# Patient Record
Sex: Male | Born: 1980 | Race: Black or African American | Hispanic: No | Marital: Single | Smoking: Former smoker
Health system: Southern US, Community
[De-identification: ages and names within clinical notes are randomized; demographics above are authoritative.]

## PROBLEM LIST (undated history)

## (undated) DIAGNOSIS — D571 Sickle-cell disease without crisis: Secondary | ICD-10-CM

## (undated) DIAGNOSIS — D5701 Hb-SS disease with acute chest syndrome: Secondary | ICD-10-CM

## (undated) HISTORY — PX: TONSILLECTOMY: SUR1361

## (undated) HISTORY — PX: MINI SHUNT INSERTION: SHX5337

## (undated) HISTORY — PX: OTHER SURGICAL HISTORY: SHX169

## (undated) HISTORY — PX: APPENDECTOMY: SHX54

---
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
## 2004-02-14 ENCOUNTER — Emergency Department (HOSPITAL_COMMUNITY): Admission: EM | Admit: 2004-02-14 | Discharge: 2004-02-14 | Payer: Self-pay | Admitting: Emergency Medicine

## 2004-09-13 ENCOUNTER — Inpatient Hospital Stay (HOSPITAL_COMMUNITY): Admission: EM | Admit: 2004-09-13 | Discharge: 2004-09-17 | Payer: Self-pay | Admitting: Emergency Medicine

## 2007-08-25 ENCOUNTER — Emergency Department (HOSPITAL_COMMUNITY): Admission: EM | Admit: 2007-08-25 | Discharge: 2007-08-25 | Payer: Self-pay | Admitting: Emergency Medicine

## 2007-08-26 ENCOUNTER — Inpatient Hospital Stay (HOSPITAL_COMMUNITY): Admission: EM | Admit: 2007-08-26 | Discharge: 2007-08-27 | Payer: Self-pay | Admitting: Emergency Medicine

## 2007-09-02 ENCOUNTER — Emergency Department (HOSPITAL_COMMUNITY): Admission: EM | Admit: 2007-09-02 | Discharge: 2007-09-02 | Payer: Self-pay | Admitting: Emergency Medicine

## 2007-09-05 ENCOUNTER — Emergency Department (HOSPITAL_COMMUNITY): Admission: EM | Admit: 2007-09-05 | Discharge: 2007-09-05 | Payer: Self-pay | Admitting: *Deleted

## 2007-09-17 ENCOUNTER — Emergency Department (HOSPITAL_COMMUNITY): Admission: EM | Admit: 2007-09-17 | Discharge: 2007-09-17 | Payer: Self-pay | Admitting: Emergency Medicine

## 2007-09-19 ENCOUNTER — Emergency Department (HOSPITAL_COMMUNITY): Admission: EM | Admit: 2007-09-19 | Discharge: 2007-09-19 | Payer: Self-pay | Admitting: Emergency Medicine

## 2007-09-22 ENCOUNTER — Emergency Department (HOSPITAL_COMMUNITY): Admission: EM | Admit: 2007-09-22 | Discharge: 2007-09-22 | Payer: Self-pay | Admitting: Emergency Medicine

## 2007-09-24 ENCOUNTER — Observation Stay (HOSPITAL_COMMUNITY): Admission: EM | Admit: 2007-09-24 | Discharge: 2007-09-24 | Payer: Self-pay | Admitting: Emergency Medicine

## 2008-10-23 ENCOUNTER — Emergency Department (HOSPITAL_COMMUNITY): Admission: EM | Admit: 2008-10-23 | Discharge: 2008-10-23 | Payer: Self-pay | Admitting: Emergency Medicine

## 2009-01-21 ENCOUNTER — Emergency Department (HOSPITAL_COMMUNITY): Admission: EM | Admit: 2009-01-21 | Discharge: 2009-01-21 | Payer: Self-pay | Admitting: Emergency Medicine

## 2009-05-02 ENCOUNTER — Emergency Department (HOSPITAL_COMMUNITY): Admission: EM | Admit: 2009-05-02 | Discharge: 2009-05-02 | Payer: Self-pay | Admitting: Emergency Medicine

## 2009-05-07 ENCOUNTER — Emergency Department (HOSPITAL_COMMUNITY): Admission: EM | Admit: 2009-05-07 | Discharge: 2009-05-07 | Payer: Self-pay | Admitting: Emergency Medicine

## 2009-05-15 ENCOUNTER — Emergency Department (HOSPITAL_COMMUNITY): Admission: EM | Admit: 2009-05-15 | Discharge: 2009-05-15 | Payer: Self-pay | Admitting: Emergency Medicine

## 2009-07-08 ENCOUNTER — Emergency Department (HOSPITAL_COMMUNITY): Admission: EM | Admit: 2009-07-08 | Discharge: 2009-07-08 | Payer: Self-pay | Admitting: Emergency Medicine

## 2009-11-20 ENCOUNTER — Emergency Department (HOSPITAL_COMMUNITY): Admission: EM | Admit: 2009-11-20 | Discharge: 2009-11-20 | Payer: Self-pay | Admitting: Emergency Medicine

## 2009-12-06 ENCOUNTER — Emergency Department (HOSPITAL_COMMUNITY): Admission: EM | Admit: 2009-12-06 | Discharge: 2009-12-06 | Payer: Self-pay | Admitting: Emergency Medicine

## 2009-12-23 ENCOUNTER — Emergency Department (HOSPITAL_COMMUNITY)
Admission: EM | Admit: 2009-12-23 | Discharge: 2009-12-23 | Payer: Self-pay | Source: Home / Self Care | Admitting: Emergency Medicine

## 2010-04-01 LAB — BASIC METABOLIC PANEL
GFR calc Af Amer: >60 mL/min (ref >60)
Glucose, Bld: 108 mg/dL — ABNORMAL HIGH (ref 70–99)
Potassium: 3.5 mEq/L (ref 3.5–5.1)

## 2010-04-01 LAB — CBC
MCH: 24.9 pg — ABNORMAL LOW (ref 26.0–34.0)
MCV: 71.9 fL — ABNORMAL LOW (ref 78.0–100.0)
Platelets: 367 10*3/uL (ref 150–400)
WBC: 13.7 10*3/uL — ABNORMAL HIGH (ref 4.0–10.5)

## 2010-04-02 LAB — COMPREHENSIVE METABOLIC PANEL
ALT: 33 U/L (ref 0–53)
AST: 41 U/L — ABNORMAL HIGH (ref 0–37)
AST: 43 U/L — ABNORMAL HIGH (ref 0–37)
Albumin: 4.1 g/dL (ref 3.5–5.2)
Alkaline Phosphatase: 55 U/L (ref 39–117)
Alkaline Phosphatase: 57 U/L (ref 39–117)
Chloride: 107 mEq/L (ref 96–112)
Chloride: 108 mEq/L (ref 96–112)
Creatinine, Ser: 0.75 mg/dL (ref 0.4–1.5)
Glucose, Bld: 87 mg/dL (ref 70–99)
Potassium: 3.7 mEq/L (ref 3.5–5.1)
Sodium: 141 mEq/L (ref 135–145)
Total Bilirubin: 7.8 mg/dL — ABNORMAL HIGH (ref 0.3–1.2)

## 2010-04-02 LAB — CBC
Hemoglobin: 9.2 g/dL — ABNORMAL LOW (ref 13.0–17.0)
Hemoglobin: 9.6 g/dL — ABNORMAL LOW (ref 13.0–17.0)
MCH: 27 pg (ref 26.0–34.0)
MCHC: 34 g/dL (ref 30.0–36.0)
MCV: 79.3 fL (ref 78.0–100.0)
MCV: 79.5 fL (ref 78.0–100.0)
RBC: 3.39 MIL/uL — ABNORMAL LOW (ref 4.22–5.81)
RBC: 3.58 MIL/uL — ABNORMAL LOW (ref 4.22–5.81)
WBC: 9.9 10*3/uL (ref 4.0–10.5)

## 2010-04-02 LAB — DIFFERENTIAL
Basophils Absolute: 0 10*3/uL (ref 0.0–0.1)
Blasts: 0 %
Eosinophils Absolute: 0 10*3/uL (ref 0.0–0.7)
Eosinophils Absolute: 0.1 10*3/uL (ref 0.0–0.7)
Eosinophils Relative: 0 % (ref 0–5)
Lymphocytes Relative: 24 % (ref 12–46)
Lymphs Abs: 2.5 10*3/uL (ref 0.7–4.0)
Lymphs Abs: 4.2 10*3/uL — ABNORMAL HIGH (ref 0.7–4.0)
Monocytes Absolute: 1.6 10*3/uL — ABNORMAL HIGH (ref 0.1–1.0)
Monocytes Absolute: 1.7 10*3/uL — ABNORMAL HIGH (ref 0.1–1.0)
Myelocytes: 0 %
Neutro Abs: 4 10*3/uL (ref 1.7–7.7)
Neutrophils Relative %: 40 % — ABNORMAL LOW (ref 43–77)
Neutrophils Relative %: 56 % (ref 43–77)
nRBC: 3 /100 WBC — ABNORMAL HIGH

## 2010-04-02 LAB — RETICULOCYTES
RBC.: 3.32 MIL/uL — ABNORMAL LOW (ref 4.22–5.81)
RBC.: 3.56 MIL/uL — ABNORMAL LOW (ref 4.22–5.81)
Retic Count, Absolute: 325.4 10*3/uL — ABNORMAL HIGH (ref 19.0–186.0)
Retic Ct Pct: 9.8 % — ABNORMAL HIGH (ref 0.4–3.1)

## 2010-04-07 LAB — URINALYSIS, ROUTINE W REFLEX MICROSCOPIC
Glucose, UA: NEGATIVE mg/dL
Hgb urine dipstick: NEGATIVE
Ketones, ur: NEGATIVE mg/dL
Nitrite: NEGATIVE
Urobilinogen, UA: 1 mg/dL (ref 0.0–1.0)
pH: 5.5 (ref 5.0–8.0)

## 2010-04-07 LAB — COMPREHENSIVE METABOLIC PANEL
ALT: 31 U/L (ref 0–53)
BUN: 8 mg/dL (ref 6–23)
CO2: 26 mEq/L (ref 19–32)
Calcium: 8.6 mg/dL (ref 8.4–10.5)
Creatinine, Ser: 0.62 mg/dL (ref 0.4–1.5)
GFR calc Af Amer: >60 mL/min (ref >60)
GFR calc non Af Amer: >60 mL/min (ref >60)
Sodium: 137 mEq/L (ref 135–145)
Total Protein: 8 g/dL (ref 6.0–8.3)

## 2010-04-07 LAB — POCT I-STAT, CHEM 8
HCT: 30 % — ABNORMAL LOW (ref 39.0–52.0)
Hemoglobin: 10.2 g/dL — ABNORMAL LOW (ref 13.0–17.0)
Potassium: 3.7 mEq/L (ref 3.5–5.1)
TCO2: 23 mmol/L (ref 0–100)

## 2010-04-07 LAB — CBC
HCT: 27.8 % — ABNORMAL LOW (ref 39.0–52.0)
HCT: 26.3 % — ABNORMAL LOW (ref 39.0–52.0)
MCHC: 33.8 g/dL (ref 30.0–36.0)
MCV: 81.5 fL (ref 78.0–100.0)
Platelets: 266 10*3/uL (ref 150–400)
RDW: 25 % — ABNORMAL HIGH (ref 11.5–15.5)

## 2010-04-07 LAB — RAPID URINE DRUG SCREEN, HOSP PERFORMED
Amphetamines: NOT DETECTED
Barbiturates: NOT DETECTED
Benzodiazepines: NOT DETECTED
Cocaine: NOT DETECTED
Tetrahydrocannabinol: NOT DETECTED

## 2010-04-07 LAB — DIFFERENTIAL
Band Neutrophils: 0 % (ref 0–10)
Basophils Absolute: 0 10*3/uL (ref 0.0–0.1)
Blasts: 0 %
Blasts: 0 %
Eosinophils Absolute: 0.2 10*3/uL (ref 0.0–0.7)
Eosinophils Relative: 2 % (ref 0–5)
Eosinophils Relative: 2 % (ref 0–5)
Lymphocytes Relative: 38 % (ref 12–46)
Lymphocytes Relative: 35 % (ref 12–46)
Lymphs Abs: 5.1 10*3/uL — ABNORMAL HIGH (ref 0.7–4.0)
Metamyelocytes Relative: 0 %
Neutro Abs: 7.7 10*3/uL (ref 1.7–7.7)
Neutrophils Relative %: 44 % (ref 43–77)
Neutrophils Relative %: 50 % (ref 43–77)
Promyelocytes Absolute: 0 %
Promyelocytes Absolute: 0 %
nRBC: 0 /100 WBC

## 2010-04-07 LAB — RETICULOCYTES
RBC.: 3.39 MIL/uL — ABNORMAL LOW (ref 4.22–5.81)
Retic Ct Pct: 9 % — ABNORMAL HIGH (ref 0.4–3.1)

## 2010-04-09 LAB — DIFFERENTIAL
Blasts: 0 %
Eosinophils Absolute: 0 10*3/uL (ref 0.0–0.7)
Eosinophils Relative: 0 % (ref 0–5)
Metamyelocytes Relative: 0 %
Myelocytes: 0 %
Promyelocytes Absolute: 0 %
nRBC: 0 /100 WBC

## 2010-04-09 LAB — CBC
MCHC: 32.9 g/dL (ref 30.0–36.0)
MCV: 85.6 fL (ref 78.0–100.0)
Platelets: 347 10*3/uL (ref 150–400)
RDW: 26.2 % — ABNORMAL HIGH (ref 11.5–15.5)

## 2010-04-09 LAB — RETICULOCYTES
RBC.: 3.05 MIL/uL — ABNORMAL LOW (ref 4.22–5.81)
Retic Ct Pct: 8.6 % — ABNORMAL HIGH (ref 0.4–3.1)

## 2010-04-10 LAB — RAPID STREP SCREEN (MED CTR MEBANE ONLY): Streptococcus, Group A Screen (Direct): NEGATIVE

## 2010-04-25 LAB — POCT I-STAT, CHEM 8
Creatinine, Ser: 0.7 mg/dL (ref 0.4–1.5)
Hemoglobin: 9.9 g/dL — ABNORMAL LOW (ref 13.0–17.0)
Sodium: 140 mEq/L (ref 135–145)
TCO2: 25 mmol/L (ref 0–100)

## 2010-06-04 NOTE — H&P (Signed)
Kerry Hill, Kerry Hill                 ACCOUNT NO.:  [id number]   MEDICAL RECORD NO.:  [id number]          PATIENT TYPE:  OBV   LOCATION:  1436                         FACILITY:  [facility]   PHYSICIAN:  Maretta Bees. Vonita Moss, M.D.DATE OF BIRTH:  08-17-1980   DATE OF ADMISSION:  09/24/2007  DATE OF DISCHARGE:  09/24/2007                              HISTORY & PHYSICAL   CHIEF COMPLAINT:  Priapism.   HISTORY OF PRESENT ILLNESS:  Kerry Hill is a 30 year old African American  male with a history of sickle cell SS disease, who is well known to our  practice.  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  He  has been seen probably 6-7 times in our emergency department within the  last month, and he has also seen his urologist and hematologist at [facility].  More recently, he was seen in our emergency department on  September 02, 2007, where he had irrigation and phenylephrine, and again,  on September 05, 2007.  He was seen at [facility] on September 16, 2007 and then  back in our emergency department on September 17, 2007.  On September 19, 2007, again, he was irrigated, given phenylephrine, and was given an  injection of Lupron at that time.  He returned on September 22, 2007 and  was again irrigated and treated with phenylephrine.  He was seen at [facility] on September 23, 2007 but this was a scheduled appointment and  he did not have priapism at that time.  He states that he went home  yesterday afternoon.  When he fell asleep last night, he woke up this  morning with a return of his priapism.  He returns today to the  emergency department complaining of the exact same symptoms he has  reported within the last month.  His erection is nonpurposeful and  painful.  He denies no change from his prior symptomatology.  Of note,  he did have a right-sided Winter's shunt done last week at [facility] and has a large hematoma towards the  distal end of his shaft,  which he states has been unchanged.  Otherwise, he denies any changes of  his recent health.   PAST MEDICAL HISTORY:  Positive for sickle cell disease.   PAST SURGICAL HISTORY:  None.   CURRENT MEDICATIONS:  None.   ALLERGIES:  None.   SOCIAL HISTORY:  He attends [employer].  He is single.  He does have an 12-  month-old son.  He does not smoke, drink alcohol, or use illicit drugs.   FAMILY HISTORY:  Noncontributory.  There is no known family history of  genitourinary malignancy or nephrolithiasis.   REVIEW OF SYSTEMS:  A 10-point review of systems was conducted, and with  the exception of the history of present illness, is negative.   PHYSICAL EXAMINATION:  A well-developed and well-nourished, pleasant 30-  year-old [place] American male awake, alert and oriented x3, nontoxic in  appearance.  Temperature, he is afebrile right now.  His blood pressure is 147/72.  Pulse is 65.  Respiratory rate is 12.  Pulse ox is 96-100% on room air.  LUNGS:  Clear to auscultation bilaterally.  HEART:  Regular rate and rhythm without murmurs, clicks, rubs, or extra  sounds.  ABDOMEN:  Without visible mass or pulsation.  Soft, nontender,  nondistended.  No rebound or guarding.  GU:  Normal male scrotal anatomy.  Testicles are descended bilaterally,  nontender without mass or tenderness.  He has a prominent erection  similar to his exam two days ago.  It is tender to palpation.  His  corporal bodies are palpated proximally, and they are firm.  His glans  is soft, but he does have a persistent large hematoma involving the  distal two-thirds of his penile shaft.  EXTREMITIES:  Warm and dry without clubbing, cyanosis or edema.  MENTAL STATUS:  Appropriate.  Speech is clear.   PROCEDURE:  With the patient in a supine position with his verbal  consent given, we cleansed his phallus with Betadine solution and  prepped and draped him sterilely.  We anesthetized the skin with 2%   plain lidocaine, placed a 21 gauge butterfly needle in his right  corporal body and irrigated 500 cc of light maroon-colored blood fairly  easily.  He was given a total of the three separate 1 ml aliquots of  phenylephrine with prompt detumescence.  The butterfly needle was  removed, and pressure was held over the site.  There was no hematoma  here.  A Coban dressing was applied lightly.   During the aspiration/irrigation, he became diaphoretic and dizzy.  He  was hemodynamically stable throughout.  He did have a hemoglobin checked  prior to the irrigation, and it was 8.6, which is not far from his  baseline.  In light of this, being that this is the first time he became  symptomatic with irrigation, we have elected to admit him to the 23-hour  observation unit, keep him hydrated, keep him on nasal oxygen, and  recheck a hemoglobin in the morning.  Hopefully we can offer him some  symptomatic control as well as get a handle on why his priapism is so  recurrent.   ASSESSMENT:  A 30 year old African American male with sickle cell  anemia, recent priapism.   PLAN:  Admit to 23-hour observation.      Melina Schools, MD      Maretta Bees. Vonita Moss, M.D.  Electronically Signed    JR/MEDQ  D:  09/24/2007  T:  09/24/2007  Job:  [id number]

## 2010-06-04 NOTE — Consult Note (Signed)
NAMEQAIS, JOWERS                 ACCOUNT NO.:  [id number]   MEDICAL RECORD NO.:  [id number]          PATIENT TYPE:  EMS   LOCATION:  ED                           FACILITY:  [facility]   PHYSICIAN:  Lindaann Slough, M.D.  DATE OF BIRTH:  May 18, 1980   DATE OF CONSULTATION:  09/02/2007  DATE OF DISCHARGE:  09/02/2007                                 CONSULTATION   REASON FOR CONSULTATION:  Priapism.   The patient is 30 years old male with a history of sickle cell disease.  He had an episode of priapism this morning at home around 7 o'clock and  he has continued to have penile pain and came to the emergency room for  further evaluation.  He has been having priapism on and off since age  21.  He had a Winter or [place] shunt several years ago.  However, he  continues to have priapism.  He was seen in the emergency room here on  August 6th for an episode of priapism and was treated at [facility] 2-3 days  ago.  He does not have any voiding symptoms.   PAST MEDICAL HISTORY:  Is positive for sickle cell disease.   SOCIAL HISTORY:  He is single, has one child who is a 29-year-old.   PAST SURGICAL HISTORY:  He had a penile shunt, appendectomy and  tonsillectomy.   FAMILY HISTORY:  His parents have sickle cell trait and his sister has  sickle cell trait.   MEDICATIONS:  Amoxicillin for sore throat and Percocet for pain.   ALLERGIES:  NO KNOWN DRUG ALLERGIES.   REVIEW OF SYSTEMS:  Is as noted in the HPI and everything else is  negative.   PHYSICAL EXAMINATION:  This is a well-developed 76 years of male who is  complaining of penile pain.  He is alert and oriented to time, place and  person.  Blood pressure is 141/69, pulse 86, respirations 20.  His head  is normal.  He has pink conjunctivae.  NECK:  Is supple.  He has no cervical adenopathy, no thyromegaly.  LUNGS:  Lungs are clear to percussion and auscultation.  HEART:  Regular rhythm.  ABDOMEN: Abdomen is soft, nondistended, nontender.  He  has no CVA  tenderness.  Kidneys are not palpable.  Bladder is not distended.  Penis is erect and tender and the scrotum is normal.  Both testicles  feel normal.   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   PLAN:  I prepped the penis with Betadine solution, infiltrated the  penile skin with 2% lidocaine.  I then and placed a #14 gauge needle on  the right corpus and drained large amount of blood and on the left side  of the corpus I inserted a 25 gauge needle and injected 1 mL of 100 mcg  of Neo-Synephrine.  There was some detumescence and I continue to  irrigate the corpus and removed and aspirated more blood out.  I  injected a total of 4 mL in increments of 1mL of Neo-Synephrine in the  left corpus for a total of 400  mcg of Neo-Synephrine.  He had good  response and then the  penis was soft and nontender and he had good pain relief.  I then  removed both needles from the corpus and I applied some pressure  dressing to the penis.  The patient states that he is going to [facility] for  further evaluation and a blood transfusion today.  We will see him here  as needed.      Lindaann Slough, M.D.  Electronically Signed     MN/MEDQ  D:  09/02/2007  T:  09/02/2007  Job:  [id number]

## 2010-06-04 NOTE — Consult Note (Signed)
Kerry Hill, Kerry Hill NO.:  [id number]   MEDICAL RECORD NO.:  [id number]          PATIENT TYPE:  EMS   LOCATION:  ED                           FACILITY:  [facility]   PHYSICIAN:  Courtney Paris, M.D.DATE OF BIRTH:  12-Apr-1980   DATE OF CONSULTATION:  09/19/2007  DATE OF DISCHARGE:  09/19/2007                                 CONSULTATION   REASON FOR CONSULTATION:  Priapism and sickle-cell disease.   This 30 year old black male with sickle cell disease since age 57 enters  with recurrent priapism.  He was here irrigated by Dr. Brunilda Payor on August  16.  He went to [facility] on the 27th and was a irrigated and again on the  28th by Dr. Vernie Ammons.  The priapism started about 4 hours ago and he  comes in for pain.  He has had this repeatedly since he was 16.  While  in the [employer] living in [place] he received Lupron for about a year or  maybe longer and during that time did not have any priapism.  He is  asking for that again today.  Both his parents have sickle cell trait.  He has one sister who did not have the trait.  He has little boy who  also has the trait.  He is not married.  He is incapacitated by sickle  cell disease and comes in for treatment of priapism.   Past medical history, social history and review of systems are as noted  and negative except otherwise.  He was seen for a sickle cell crisis on  August 6.  He has had a lot more this month than he has had in, he says,  8 years.  He apparently had either a Winter or an [place] shunt  several years ago.  Again, like I said, he has been seen at [facility] twice  this month and then this is the third visit to the emergency room for  irrigation this month as well.   PHYSICAL EXAM:  CONSTITUTIONAL:  He is a young black male in some pain  but he has had some pain medicine and has had terbutaline and oxygen,  and none of this seemed to help relieve this.  VITAL SIGNS:  His temperature is 97.4 is pulses 53.  His blood  pressure  is 128/71.  HEENT:  Exam is negative.  HEART AND LUNGS:  Clear.  ABDOMEN:  Soft, benign.  GENITOURINARY:  Penis is markedly engorged but the glans is soft.  It is  typical of priapism.  The distal half of his penis is fairly swollen  which he says has been normal recently.  Testes are normally descended  without pain or tenderness.  Bilaterally descended.  EXTREMITIES:  Negative.  No edema.   After he was sedated somewhat with pain medicine and was prepped and  draped in a sterile fashion and a little bit of 1% Xylocaine with 1:1000  epinephrine, a little small weal was made on each side, first the left  and then the right side of the penis at about 3 and  9 o'clock.  On the  left side, I placed an 18 gauge needle and then proceeded to aspirate  probably about 800 mL of blood.  This this cause some tumescence.  He  was still a little swollen so I injected some 1:1000 epinephrine in the  dose of 1 mg of Neo-Synephrine in 100 mL of fluid.  He got 1 mL of this  on the right side and then this was repeated on the left side.  I was  unable to aspirate any more after this.  He says this is with his penis,  although still somewhat swollen and somewhat woody in appearance, he  says this was his normal state.  He does not having pain but was still  somewhat swollen but flaccid.  About 800 mL of blood was aspirated.   By his choice he wanted Lupron.  I ordered 7.5 mg of Lupron IM given  which we will give him this for the next month or 2.  He knows that he  will not have erections on the Lupron and may have some hot flashes as  well.  Further long-term disposition of this needs to be made probably  at [facility] where he goes for his sickle cell.      Courtney Paris, M.D.  Electronically Signed     HMK/MEDQ  D:  09/19/2007  T:  09/19/2007  Job:  [id number]

## 2010-06-04 NOTE — H&P (Signed)
NAMEDIANDRE, Kerry Hill NO.:  [id number]   MEDICAL RECORD NO.:  [id number]          PATIENT TYPE:  INP   LOCATION:  0101                         FACILITY:  [facility]   PHYSICIAN:  Vania Rea, M.D. DATE OF BIRTH:  11/02/1980   DATE OF ADMISSION:  08/26/2007  DATE OF DISCHARGE:                              HISTORY & PHYSICAL   PRIMARY CARE PHYSICIAN:  Dr. James Ivanoff at [facility].   CHIEF COMPLAINT:  Priapism.   HISTORY OF PRESENT ILLNESS:  This is a 30 year old African American  gentleman with a history of sickle cell SS disease who came to the  emergency room early yesterday morning for chest congestion and upper  respiratory symptoms.  He was evaluated and sent home with Motrin.  He  woke up yesterday afternoon with a painful achy erection.  Five hours  later, the erection persisted.  He took Viagra as directed by his  urologist at [facility], who apparently are evaluating Viagra as a treatment  for priapism.  There was no relief of symptoms and eventually he came to  the emergency room.  In the emergency room, he is being seen by the  urologist is draining priapism and he has had a much relief from this.  However, because of his abnormal laboratory findings and his continued  chest pain, chest congestion and sore throat, the hospitalist service  was called to assist with management.   The patient says he usually requires hospitalization about once every 3  years for sickle crisis and is actually fairly stable.  He does not take  any medications at home for pain.  He has not really noticed any fevers,  but he was visited by his children and his children's mother  the day  before yesterday and they had upper respiratory symptoms.  He has had no  bloody cough and he has no limb pain.   PAST MEDICAL HISTORY:  Sickle cell disease.   MEDICATIONS:  Other than those above, none.   ALLERGIES:  NO KNOWN DRUG ALLERGIES.   SOCIAL HISTORY:  Denies tobacco or  illicit drug use.  He occasionally  takes a drink of alcohol.   FAMILY HISTORY:  No family history of heart disease, diabetes,  hypertension or sickle cell disease.   REVIEW OF SYSTEMS:  Other than noted above, 10-point review of systems  was unremarkable.   PHYSICAL EXAMINATION:  GENERAL:  A well-built young African American  gentleman lying in bed, mouth breathing, but otherwise in no distress.  VITAL SIGNS:  His temperature is 97.8, pulse is 105, respirations 20,  blood pressure 156/85.  Saturating at 96% on room air.  HEENT:  His pupils are round and equal.  Mucous membranes pink but  icteric.  His mouth is dry and he has yellow exudate around the  posterior pharynx.  NECK:  He has no cervical lymphadenopathy.  No thyromegaly or  jugulovenous distention.  CHEST:  Clear to auscultation bilaterally.  CARDIOVASCULAR SYSTEM:  Regular rhythm.  ABDOMEN:  Soft and nontender.  There are no masses.  EXTREMITIES: Without edema.  He has  2+ pulses bilaterally.  CENTRAL NERVOUS SYSTEM:  Cranial nerves II-XII are grossly intact.  He  has no focal neurologic deficit.   LABORATORY DATA:  His white count is 29.9, hemoglobin 9.6, MCV 74.8,  platelets 340.  His sodium is 138, potassium 3.4, chloride 105, CO2 of  26, BUN 6, creatinine 0.76, glucose 98.  His total protein is 7.9,  albumin 4.  AST and ALT are normal.  For some reason, I do not see a  bilirubin.  His chest x-ray shows pulmonary hyperaeration, pulmonary  vascular congestion, no evidence of pulmonary edema.   ASSESSMENT:  1. Acute purulent pharyngitis.  2. Priapism.  3. Sickle cell disease with current mild crisis.  4. Unexplained microcytic anemia.   PLAN:  Will admit this gentleman to the medical floor.  Urologist has  already been consulted.  Will treat him with IV fluids.  Culture his  throat and treat him with penicillin for the time being.  Will do iron  studies because of his unexplained microcytosis.  Other plans as per   orders.      Vania Rea, M.D.  Electronically Signed     LC/MEDQ  D:  08/26/2007  T:  08/26/2007  Job:  [id number]   cc:   Jacques Navy, MD  Division of Hematology  [facility]

## 2010-06-07 NOTE — Discharge Summary (Signed)
[place]  Patient:    Kerry Hill, Kerry Hill                          MRN: [id number] Adm. Date:  [id number] Disc. Date: [id number] Attending:  Judie Petit                           Discharge Summary  ADMITTING DIAGNOSES: 1. Pneumonia. 2. Sickle cell disease, rule out early acute chest syndrome. 3. Intermittent priapism.  DISCHARGE DIAGNOSIS: 1. Pneumonia. 2. Sickle cell disease, rule out early acute chest syndrome. 3. Intermittent priapism.  DISCHARGE CONDITION:  Stable.  DISPOSITION:  Followup in my office in approximately between one and two weeks.  DISCHARGE MEDICATIONS: 1. Biaxin 500 mg p.o. b.i.d. for 21 days. 2. Ibuprofen 800 mg t.i.d. for pain. 3. Oxycontin CR 40 mg b.i.d. p.r.n. pain. 4. Sudafed 50 mg t.i.d. or q.i.d. in addition for priapism episodes.  CONSULTANT:  Dr. Mahalia Longest, [facility], pediatric department, the patients primary care physician from that area.  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 The patient was seen last week in my office and had routine labs done, which were unremarkable. He is from [facility] in [place], [place]. His only sickle cell manifestations was episodes of intermittent pain an priapism. The patient was discussed with Dr. Mahalia Longest, pediatric rheumatologist at [facility] in [place], and we decided to try Sudafed 50 mg with Ibuprofen 800 mg and Oxycontin CR 40 mg b.i.d. for his priapism episodes. The patient apparently got complete resolution of this problem. He had had a hospitalization in [place] earlier and required an injection of dilute epinephrine by a urologist. This was discussed as an alternative for the patient to be able to do in the  outpatient setting, but he respectfully declined to consider that one further. His presenting problem mostly was associated with burning taking a deep breath and pain his lower chest regions with intermittent cough. He had also awakened with rapid respirations, which were rather shallow, during the middle of the previous night. He denied pain elsewhere and was attempting to continue attending his class attendance.  He did call the morning of admission and was requested to come in the office after discussing his changes. Thus, the temperature was 99.9 and auscultation of his chest revealed rhonchi/rales of his right and left lung bases, being more pronounced on the left, with inability to take a deep breath. He was worked up with a chest x-ray which revealed basilar atelectasis versus pneumonia, and he was admitted for further treatment.  PAST MEDICAL HISTORY:  The patient had been hospitalized in the past with pneumonia and priapism, sickle cell vaso-occlusive crisis.  HOME MEDICATIONS: 1. Brethine 5 mg occasional use. 2. Hydroxyurea 5 mg b.i.d. 3. Motrin 800 mg t.i.d. 4. Folate 1 mg daily. 5. Oxycontin CR 40 mg b.i.d. 6. Sudafed 50 mg t.i.d. or q.i.d. p.r.n. priapism.  PERSONAL HISTORY:  The patient does not use alcohol, tobacco, drugs or PICA.  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  REVIEW OF SYSTEMS:  Otherwise, remarkable only for intermittent priapism.  PHYSICAL EXAMINATION:  VITAL SIGNS:  Blood pressure  140/70, pulse 100, respirations 20, temperature 101.9 degrees orally at the hospital. GENERAL: The patient was alert, ambulatory, healthy-appearing, right-handed black male. HEENT:  Moderate scleral icterus. The patient was wearing glasses. NECK: Supple without jugular vein distension, bruit, nodes or thyromegaly. CHEST: Rales and rhonchi at the left greater than the right lung bases  posteriorly and splinting expirations. HEART:  Regular rate and rhythm without murmur. ABDOMEN:  Soft and flat. Bowel sounds normal. GENITALIA:  Bilaterally descended testicles, circumcised penis. Priapism had resolved. EXTREMITIES: Normal. NEUROLOGIC:  The patient was grossly alert and fully oriented. He was ambulatory without difficulty. SKIN:  Slight hyperthermia.  LABORATORY DATA:  Chest x-ray revealed cardiomegaly with proper atelectasis in the left base, although pneumonia could not be excluded. Repeat films were reviewed and improved aeration with persistent small bilateral effusions. Interval x-ray revealed interval increase in left lower lobe pneumonia with x-ray on October 4 revealing improved aeration with persistent small bilateral effusions.  CBC revealed a white count of 16,700 with a hemoglobin of 9.8, hematocrit 28.5, platelets 239,000 with followup revealing white count down to 14.5 with a hemoglobin of 9.3 and platelets 338,000. Retic count was 14.7. Chemistries initially revealed a sodium of 130, potassium 3.7, glucose 168, calcium 8.3, AST 41 and total bilirubin 6.71. Repeat prior to discharge revealed parameters had returned to normal, except for albumin reduced to 3.2 with AST of 53, ALT 42, total bilirubin 3.21. Blood cultures x 2 and urine culture had no growth.  HOSPITAL COURSE:  The patient was admitted to the hospital and given the same home medicines; Tylenol 650 mg was alternated with Ibuprofen 600 mg q.3h. for fever and pain. He was given Dilaudid 2 mg IV q.2-4h. p.r.n. pain. He was given Rocephin 1 g IV daily, and gradually improved. He was continued on hydroxyurea 1000 mg daily. The patient had greater evolution of his splinting expirations and was begun on Biaxin 5 mg p.o. b.i.d., and was comfortable on oral medicines. He had received Tequin 4 mg IV daily, also. After discussion with Dr. Rebeca Allegra at [facility], he suggested the patient  would be well-served to have Biaxin b.i.d. for 21 days or so. Thus, this was done and the patient was comfortable with discharge outpatient management. DD:  11/04/99 TD:  11/04/99 Job: [id number]  [id number]

## 2010-06-07 NOTE — Discharge Summary (Signed)
Kerry Hill, Kerry Hill                 ACCOUNT NO.:  [id number]   MEDICAL RECORD NO.:  [id number]          PATIENT TYPE:  INP   LOCATION:  1317                         FACILITY:  [facility]   PHYSICIAN:  Lorelle Formosa, M.D.DATE OF BIRTH:  Nov 07, 1980   DATE OF ADMISSION:  09/13/2004  DATE OF DISCHARGE:  09/17/2004                                 DISCHARGE SUMMARY   ADMISSION DIAGNOSES:  1.  Sickle cell SS vaso-occlusive crisis.  2.  Pyrexia.   DISCHARGE DIAGNOSES:  1.  Sickle cell SS vaso-occlusive crisis.  2.  Acute chest syndrome of moderate type.   DISCHARGE CONDITION:  Stable.   DISCHARGE DISPOSITION:  Follow up in my office in approximately 1-2 weeks.   DISCHARGE MEDICATIONS:  1.  MS Contin 60 mg b.i.d.  2.  MSIR 15 mg q.i.d. p.r.n. breakthrough pain.  3.  Hydroxyurea 1500mg   daily.  4.  Zithromax 250 mg daily as instructed.   HISTORY:  This patient is a 30 year old single black male who has known SS  sickle cell disease.  He presented with severe pain in his lower back  unrelieved by home medicines.  His home medicines included OxyContin CR 4 mg  b.i.d., OxyIR 5 mg q.i.d. p.r.n. breakthrough pain, and ibuprofen 800 mg  t.i.d. p.r.n.  The patient drove himself to my office and was ambulatory  without significant apparent discomfort, and arrangements were made for  inpatient care; however, no beds were available, and patient went home to  take more medicine and await a vacancy.  Subdequently he did go to the  emergency room and was evaluated by Dr. Lynelle Doctor with chest x-ray, CBC, CMET,  and started on IV fluids, initiated on O2, Rocephin, and azithromycin IV.  I  was called to admit the patient.   PAST MEDICAL HISTORY:  Patient has been admitted several times with priapism  and pneumonia and vaso-occlusive crisis.   He had no known allergies.   His family history, social history, review of systems, personal history are  all on the admission history and physical.   PHYSICAL EXAMINATION:  VITAL SIGNS:  Blood pressure 135/56, pulse 93,  respirations 22, temperature 101.4.  GENERAL:  Patient was alert and oriented upon arrival.  He appeared be a  well-developed and well-nourished African-American male.  HEENT:  He had moderate scleral icterus.  Mouth exam revealed no problems  with dentition.  Tongue and uvula were both midline.  Moist mucous  membranes.  NECK:  Supple without jugular venous distention or bruits.  No palpable  thyromegaly.  LUNGS:  Clear to auscultation.  HEART:  Regular rate and rhythm without murmur.  ABDOMEN:  Soft.  EXTREMITIES:  No lesions.  NEUROLOGIC:  Grossly physiologic.  He is ambulatory with a rolling gait.  Grimacing and intermittent jerking due to pain.   LABS:  CBC revealed a white count of 24.4 with hemoglobin of 9 and  hematocrit 24.9.  At discharge, white count was 21.4 with hemoglobin 7.2.  Red blood cell morphology revealed marked polychromasia target cells, sickle  cell leukocytes, and oval macrocytes.  Retic percentage  was 11.5.  Chemistries were not significant except for above.  Blood cultures had no  growth.   Chest x-ray revealed no focal air space opacities to suggest pneunonia.   HOSPITAL COURSE:  The patient was admitted to the hospital and given IV  fluids of nasal cannula O2, analgesics intravenously, and monitored.  He  gradually improved and was able to be transferred to oral medicines and was  discharged with outpatient management.      Lorelle Formosa, M.D.  Electronically Signed     WWM/MEDQ  D:  10/09/2004  T:  10/10/2004  Job:  [id number]

## 2010-10-18 LAB — BASIC METABOLIC PANEL
BUN: 5 — ABNORMAL LOW
CO2: 27
Calcium: 8.6
Creatinine, Ser: 0.58
GFR calc Af Amer: >60

## 2010-10-18 LAB — DIFFERENTIAL
Basophils Relative: 0
Basophils Relative: 2 — ABNORMAL HIGH
Eosinophils Relative: 0
Eosinophils Relative: 1
Lymphocytes Relative: 16
Lymphs Abs: 2
Monocytes Absolute: 1.6 — ABNORMAL HIGH
Monocytes Relative: 10
Neutro Abs: 11.4 — ABNORMAL HIGH
Neutrophils Relative %: 80 — ABNORMAL HIGH

## 2010-10-18 LAB — POCT I-STAT, CHEM 8
BUN: 4 — ABNORMAL LOW
Calcium, Ion: 1.11 — ABNORMAL LOW
Chloride: 106
Creatinine, Ser: 0.9
TCO2: 25

## 2010-10-18 LAB — COMPREHENSIVE METABOLIC PANEL
AST: 42 — ABNORMAL HIGH
Albumin: 4
Calcium: 8.7
Chloride: 105
Creatinine, Ser: 0.76
GFR calc Af Amer: >60
Total Protein: 7.9

## 2010-10-18 LAB — VIRUS CULTURE: Virus Culture Final: NEGATIVE

## 2010-10-18 LAB — RETICULOCYTES
RBC.: 3.67 — ABNORMAL LOW
Retic Ct Pct: 11.3 — ABNORMAL HIGH
Retic Ct Pct: 9.6 — ABNORMAL HIGH

## 2010-10-18 LAB — CBC
HCT: 25.8 — ABNORMAL LOW
Hemoglobin: 9 — ABNORMAL LOW
MCHC: 34.2
MCHC: 34.8
MCHC: 34.8
MCV: 74.8 — ABNORMAL LOW
MCV: 76.8 — ABNORMAL LOW
Platelets: 317
Platelets: 340
RBC: 3.35 — ABNORMAL LOW
RBC: 3.37 — ABNORMAL LOW
WBC: 29.9 — ABNORMAL HIGH

## 2010-10-18 LAB — PROTIME-INR
INR: 1.2
Prothrombin Time: 16 — ABNORMAL HIGH

## 2010-10-18 LAB — MAGNESIUM: Magnesium: 2.2

## 2010-10-18 LAB — BILIRUBIN, FRACTIONATED(TOT/DIR/INDIR): Total Bilirubin: 7.4 — ABNORMAL HIGH

## 2010-10-18 LAB — FERRITIN: Ferritin: 103 (ref 22–322)

## 2010-10-18 LAB — CULTURE, BLOOD (ROUTINE X 2): Culture: NO GROWTH

## 2010-10-23 LAB — CBC
HCT: 26.4 — ABNORMAL LOW
MCHC: 32.7
MCV: 82.8
Platelets: 351

## 2010-11-15 ENCOUNTER — Emergency Department (HOSPITAL_COMMUNITY)
Admission: EM | Admit: 2010-11-15 | Discharge: 2010-11-15 | Disposition: A | Discharge status: Home / Self Care | Payer: Medicaid Other | Attending: Emergency Medicine | Admitting: Emergency Medicine

## 2010-11-15 DIAGNOSIS — N4830 Priapism, unspecified: Secondary | ICD-10-CM | POA: Insufficient documentation

## 2010-11-15 DIAGNOSIS — D571 Sickle-cell disease without crisis: Secondary | ICD-10-CM | POA: Insufficient documentation

## 2010-11-15 DIAGNOSIS — Z79899 Other long term (current) drug therapy: Secondary | ICD-10-CM | POA: Insufficient documentation

## 2010-11-15 LAB — CBC
HCT: 25.6 % — ABNORMAL LOW (ref 39.0–52.0)
Hemoglobin: 8.9 g/dL — ABNORMAL LOW (ref 13.0–17.0)
MCH: 25.1 pg — ABNORMAL LOW (ref 26.0–34.0)
MCHC: 34.8 g/dL (ref 30.0–36.0)
RBC: 3.54 MIL/uL — ABNORMAL LOW (ref 4.22–5.81)

## 2010-11-15 LAB — BASIC METABOLIC PANEL
BUN: 9 mg/dL (ref 6–23)
CO2: 24 mEq/L (ref 19–32)
Calcium: 9.1 mg/dL (ref 8.4–10.5)
GFR calc non Af Amer: >90 mL/min (ref >90)
Glucose, Bld: 102 mg/dL — ABNORMAL HIGH (ref 70–99)

## 2010-11-15 LAB — DIFFERENTIAL
Basophils Relative: 2 % — ABNORMAL HIGH (ref 0–1)
Eosinophils Relative: 2 % (ref 0–5)
Lymphs Abs: 4.4 10*3/uL — ABNORMAL HIGH (ref 0.7–4.0)
Monocytes Absolute: 2 10*3/uL — ABNORMAL HIGH (ref 0.1–1.0)
Neutro Abs: 6.2 10*3/uL (ref 1.7–7.7)

## 2011-05-15 ENCOUNTER — Encounter (HOSPITAL_COMMUNITY): Payer: Self-pay | Admitting: Emergency Medicine

## 2011-05-15 ENCOUNTER — Emergency Department (HOSPITAL_COMMUNITY)
Admission: EM | Admit: 2011-05-15 | Discharge: 2011-05-15 | Disposition: A | Discharge status: Home / Self Care | Payer: Medicaid Other | Attending: Emergency Medicine | Admitting: Emergency Medicine

## 2011-05-15 ENCOUNTER — Emergency Department (HOSPITAL_COMMUNITY): Payer: Medicaid Other

## 2011-05-15 DIAGNOSIS — R070 Pain in throat: Secondary | ICD-10-CM | POA: Insufficient documentation

## 2011-05-15 DIAGNOSIS — R0982 Postnasal drip: Secondary | ICD-10-CM | POA: Insufficient documentation

## 2011-05-15 DIAGNOSIS — J069 Acute upper respiratory infection, unspecified: Secondary | ICD-10-CM

## 2011-05-15 DIAGNOSIS — R220 Localized swelling, mass and lump, head: Secondary | ICD-10-CM | POA: Insufficient documentation

## 2011-05-15 DIAGNOSIS — R221 Localized swelling, mass and lump, neck: Secondary | ICD-10-CM | POA: Insufficient documentation

## 2011-05-15 DIAGNOSIS — R599 Enlarged lymph nodes, unspecified: Secondary | ICD-10-CM | POA: Insufficient documentation

## 2011-05-15 DIAGNOSIS — J3489 Other specified disorders of nose and nasal sinuses: Secondary | ICD-10-CM | POA: Insufficient documentation

## 2011-05-15 DIAGNOSIS — Z79899 Other long term (current) drug therapy: Secondary | ICD-10-CM | POA: Insufficient documentation

## 2011-05-15 DIAGNOSIS — D571 Sickle-cell disease without crisis: Secondary | ICD-10-CM | POA: Insufficient documentation

## 2011-05-15 MED ORDER — AZITHROMYCIN 250 MG PO TABS: 250.0000 mg | ORAL_TABLET | Freq: Every day | ORAL | Status: AC

## 2011-05-15 MED ORDER — AZITHROMYCIN 250 MG PO TABS
500.0000 mg | ORAL_TABLET | Freq: Once | ORAL | Status: AC
  Administered 2011-05-15: 500 mg via ORAL
  Filled 2011-05-15: qty 2

## 2011-05-15 MED ORDER — HYDROCOD POLST-CHLORPHEN POLST 10-8 MG/5ML PO LQCR: 5.0000 mL | Freq: Two times a day (BID) | ORAL | Status: DC

## 2011-05-15 NOTE — Progress Notes (Addendum)
Pt voiced concern with wanting to see another pcp during 05/15/11 ED visit Provided with list of available medicaid [place] [facility] and [facility] pcp information

## 2011-05-15 NOTE — Discharge Instructions (Signed)
Antibiotic Nonuse  Your caregiver felt that the infection or problem was not one that would be helped with an antibiotic. Infections may be caused by viruses or bacteria. Only a caregiver can tell which one of these is the likely cause of an illness. A cold is the most common cause of infection in both adults and children. A cold is a virus. Antibiotic treatment will have no effect on a viral infection. Viruses can lead to many lost days of work caring for sick children and many missed days of school. Children may catch as many as 10 "colds" or "flus" per year during which they can be tearful, cranky, and uncomfortable. The goal of treating a virus is aimed at keeping the ill person comfortable. Antibiotics are medications used to help the body fight bacterial infections. There are relatively few types of bacteria that cause infections but there are hundreds of viruses. While both viruses and bacteria cause infection they are very different types of germs. A viral infection will typically go away by itself within 7 to 10 days. Bacterial infections may spread or get worse without antibiotic treatment. Examples of bacterial infections are:  Sore throats (like strep throat or tonsillitis).   Infection in the lung (pneumonia).   Ear and skin infections.  Examples of viral infections are:  Colds or flus.   Most coughs and bronchitis.   Sore throats not caused by Strep.   Runny noses.  It is often best not to take an antibiotic when a viral infection is the cause of the problem. Antibiotics can kill off the helpful bacteria that we have inside our body and allow harmful bacteria to start growing. Antibiotics can cause side effects such as allergies, nausea, and diarrhea without helping to improve the symptoms of the viral infection. Additionally, repeated uses of antibiotics can cause bacteria inside of our body to become resistant. That resistance can be passed onto harmful bacterial. The next time  you have an infection it may be harder to treat if antibiotics are used when they are not needed. Not treating with antibiotics allows our own immune system to develop and take care of infections more efficiently. Also, antibiotics will work better for us when they are prescribed for bacterial infections. Treatments for a child that is ill may include:  Give extra fluids throughout the day to stay hydrated.   Get plenty of rest.   Only give your child over-the-counter or prescription medicines for pain, discomfort, or fever as directed by your caregiver.   The use of a cool mist humidifier may help stuffy noses.   Cold medications if suggested by your caregiver.  Your caregiver may decide to start you on an antibiotic if:  The problem you were seen for today continues for a longer length of time than expected.   You develop a secondary bacterial infection.  SEEK MEDICAL CARE IF:  Fever lasts longer than 5 days.   Symptoms continue to get worse after 5 to 7 days or become severe.   Difficulty in breathing develops.   Signs of dehydration develop (poor drinking, rare urinating, dark colored urine).   Changes in behavior or worsening tiredness (listlessness or lethargy).  Document Released: 03/17/2001 Document Revised: 12/26/2010 Document Reviewed: 09/13/2008 ExitCare Patient Information 2012 [employer].Upper Respiratory Infection, Adult An upper respiratory infection (URI) is also sometimes known as the common cold. The upper respiratory tract includes the nose, sinuses, throat, trachea, and bronchi. Bronchi are the airways leading to the lungs. Most   people improve within 1 week, but symptoms can last up to 2 weeks. A residual cough may last even longer.  CAUSES Many different viruses can infect the tissues lining the upper respiratory tract. The tissues become irritated and inflamed and often become very moist. Mucus production is also common. A cold is contagious. You can easily  spread the virus to others by oral contact. This includes kissing, sharing a glass, coughing, or sneezing. Touching your mouth or nose and then touching a surface, which is then touched by another person, can also spread the virus. SYMPTOMS  Symptoms typically develop 1 to 3 days after you come in contact with a cold virus. Symptoms vary from person to person. They may include:  Runny nose.   Sneezing.   Nasal congestion.   Sinus irritation.   Sore throat.   Loss of voice (laryngitis).   Cough.   Fatigue.   Muscle aches.   Loss of appetite.   Headache.   Low-grade fever.  DIAGNOSIS  You might diagnose your own cold based on familiar symptoms, since most people get a cold 2 to 3 times a year. Your caregiver can confirm this based on your exam. Most importantly, your caregiver can check that your symptoms are not due to another disease such as strep throat, sinusitis, pneumonia, asthma, or epiglottitis. Blood tests, throat tests, and X-rays are not necessary to diagnose a common cold, but they may sometimes be helpful in excluding other more serious diseases. Your caregiver will decide if any further tests are required. RISKS AND COMPLICATIONS  You may be at risk for a more severe case of the common cold if you smoke cigarettes, have chronic heart disease (such as heart failure) or lung disease (such as asthma), or if you have a weakened immune system. The very young and very old are also at risk for more serious infections. Bacterial sinusitis, middle ear infections, and bacterial pneumonia can complicate the common cold. The common cold can worsen asthma and chronic obstructive pulmonary disease (COPD). Sometimes, these complications can require emergency medical care and may be life-threatening. PREVENTION  The best way to protect against getting a cold is to practice good hygiene. Avoid oral or hand contact with people with cold symptoms. Wash your hands often if contact occurs.  There is no clear evidence that vitamin C, vitamin E, echinacea, or exercise reduces the chance of developing a cold. However, it is always recommended to get plenty of rest and practice good nutrition. TREATMENT  Treatment is directed at relieving symptoms. There is no cure. Antibiotics are not effective, because the infection is caused by a virus, not by bacteria. Treatment may include:  Increased fluid intake. Sports drinks offer valuable electrolytes, sugars, and fluids.   Breathing heated mist or steam (vaporizer or shower).   Eating chicken soup or other clear broths, and maintaining good nutrition.   Getting plenty of rest.   Using gargles or lozenges for comfort.   Controlling fevers with ibuprofen or acetaminophen as directed by your caregiver.   Increasing usage of your inhaler if you have asthma.  Zinc gel and zinc lozenges, taken in the first 24 hours of the common cold, can shorten the duration and lessen the severity of symptoms. Pain medicines may help with fever, muscle aches, and throat pain. A variety of non-prescription medicines are available to treat congestion and runny nose. Your caregiver can make recommendations and may suggest nasal or lung inhalers for other symptoms.  HOME CARE INSTRUCTIONS     Only take over-the-counter or prescription medicines for pain, discomfort, or fever as directed by your caregiver.   Use a warm mist humidifier or inhale steam from a shower to increase air moisture. This may keep secretions moist and make it easier to breathe.   Drink enough water and fluids to keep your urine clear or pale yellow.   Rest as needed.   Return to work when your temperature has returned to normal or as your caregiver advises. You may need to stay home longer to avoid infecting others. You can also use a face mask and careful hand washing to prevent spread of the virus.  SEEK MEDICAL CARE IF:   After the first few days, you feel you are getting worse  rather than better.   You need your caregiver's advice about medicines to control symptoms.   You develop chills, worsening shortness of breath, or brown or red sputum. These may be signs of pneumonia.   You develop yellow or brown nasal discharge or pain in the face, especially when you bend forward. These may be signs of sinusitis.   You develop a fever, swollen neck glands, pain with swallowing, or white areas in the back of your throat. These may be signs of strep throat.  SEEK IMMEDIATE MEDICAL CARE IF:   You have a fever.   You develop severe or persistent headache, ear pain, sinus pain, or chest pain.   You develop wheezing, a prolonged cough, cough up blood, or have a change in your usual mucus (if you have chronic lung disease).   You develop sore muscles or a stiff neck.  Document Released: 07/02/2000 Document Revised: 12/26/2010 Document Reviewed: 05/10/2010 Exit[employer] Patient Information 2012 [employer]. 

## 2011-05-15 NOTE — ED Provider Notes (Signed)
History     CSN: [id number]  Arrival date & time 05/15/11  1509   First MD Initiated Contact with Patient 05/15/11 1512      No chief complaint on file.   (Consider location/radiation/quality/duration/timing/severity/associated sxs/prior treatment) HPI  Pt presents to the ED with complaints of sore throat, fevers, cough. He denies abdominal pain, N/V/D, anorexia, headache, SOB, CP. Sore Throat: Patient complains of sore throat. Associated symptoms include nasal blockage, post nasal drip, sinus and nasal congestion and sore throat.Onset of symptoms was 3 days ago, stable since that time. He is drinking plenty of fluids. He does not have had recent close exposure to someone with proven streptococcal pharyngitis but is in college. He is also a sickle cell patient who denies having his normal sickle cell exacerbation symptoms of priapism.   No past medical history on file.  No past surgical history on file.  No family history on file.  History  Substance Use Topics  . Smoking status: Not on file  . Smokeless tobacco: Not on file  . Alcohol Use: Not on file      Review of Systems   HEENT: denies blurry vision or change in hearing PULMONARY: Denies difficulty breathing and SOB CARDIAC: denies chest pain or heart palpitations MUSCULOSKELETAL:  denies being unable to ambulate ABDOMEN AL: denies abdominal pain GU: denies loss of bowel or urinary control NEURO: denies numbness and tingling in extremities   Allergies  Review of patient's allergies indicates no known allergies.  Home Medications   Current Outpatient Rx  Name Route Sig Dispense Refill  . OXYCODONE HCL 15 MG PO TABS Oral Take 30 mg by mouth every 4 (four) hours as needed. PAIN    . AZITHROMYCIN 250 MG PO TABS Oral Take 1 tablet (250 mg total) by mouth daily. Take first 2 tablets together, then 1 every day until finished. 5 tablet 0    Starting tomorrow  . HYDROCOD POLST-CPM POLST ER 10-8 MG/5ML PO LQCR Oral  Take 5 mLs by mouth every 12 (twelve) hours. 140 mL 0    There were no vitals taken for this visit.  Physical Exam  Nursing note and vitals reviewed. Constitutional: He is oriented to person, place, and time. He appears well-developed and well-nourished. No distress.  HENT:  Head: Normocephalic and atraumatic. No trismus in the jaw.  Right Ear: Tympanic membrane, external ear and ear canal normal.  Left Ear: Tympanic membrane, external ear and ear canal normal.  Nose: Nose normal. No rhinorrhea. Right sinus exhibits no maxillary sinus tenderness and no frontal sinus tenderness. Left sinus exhibits no maxillary sinus tenderness and no frontal sinus tenderness.  Mouth/Throat: Uvula is midline and mucous membranes are normal. Normal dentition. No dental abscesses or uvula swelling. Oropharyngeal exudate and posterior oropharyngeal edema present. No posterior oropharyngeal erythema or tonsillar abscesses.       No submental edema, tongue not elevated, no trismus. No impending airway obstruction; Pt able to speak full sentences, swallow intact, no drooling, stridor, or tonsillar/uvula displacement. No palatal petechia  Eyes: Conjunctivae are normal.  Neck: Trachea normal, normal range of motion and full passive range of motion without pain. Neck supple. No rigidity. Erythema present. Normal range of motion present. No Brudzinski's sign noted.       Flexion and extension of neck without pain or difficulty. Able to breath without difficulty in extension.  Cardiovascular: Normal rate and regular rhythm.   Pulmonary/Chest: Effort normal and breath sounds normal. No stridor. No respiratory distress. He  has no wheezes.  Abdominal: Soft. There is no tenderness.       No obvious evidence of splenomegaly. Non ttp.   Musculoskeletal: Normal range of motion.  Lymphadenopathy:       Head (right side): No preauricular and no posterior auricular adenopathy present.       Head (left side): No preauricular and  no posterior auricular adenopathy present.    He has cervical adenopathy.  Neurological: He is alert and oriented to person, place, and time.  Skin: Skin is warm and dry. No rash noted. He is not diaphoretic.  Psychiatric: He has a normal mood and affect.    ED Course  Procedures (including critical care time)  Labs Reviewed - No data to display Dg Chest 2 View  05/15/2011  *RADIOLOGY REPORT*  Clinical Data: Cough and congestion  CHEST - 2 VIEW  Comparison: 07/08/2009  Findings: The heart size and mediastinal contours are within normal limits.  Both lungs are clear.  The visualized skeletal structures are unremarkable.  IMPRESSION: Negative exam.  Original Report Authenticated By: Rosealee Albee, M.D.     1. URI (upper respiratory infection)       MDM  Chest xray normal. Throat erythematous. Pt drinking plenty of fluids. NAD, understands plan that he needs to follow-up with his PCP. Pt prescribed Tussionex and Azithromycin.  Pt has been advised of the symptoms that warrant their return to the ED. Patient has voiced understanding and has agreed to follow-up with the PCP or specialist.         Dorthula Matas, PA 05/15/11 1623

## 2011-05-19 NOTE — ED Provider Notes (Signed)
Medical screening examination/treatment/procedure(s) were performed by non-physician practitioner and as supervising physician I was immediately available for consultation/collaboration.   Gwyneth Sprout, MD 05/19/11 [phone]

## 2011-10-03 ENCOUNTER — Observation Stay (HOSPITAL_COMMUNITY)
Admission: EM | Admit: 2011-10-03 | Discharge: 2011-10-05 | Disposition: A | Discharge status: Home / Self Care | Payer: Medicaid Other | Attending: Internal Medicine | Admitting: Internal Medicine

## 2011-10-03 ENCOUNTER — Encounter (HOSPITAL_COMMUNITY): Payer: Self-pay | Admitting: Emergency Medicine

## 2011-10-03 ENCOUNTER — Emergency Department (HOSPITAL_COMMUNITY): Payer: Medicaid Other

## 2011-10-03 DIAGNOSIS — J180 Bronchopneumonia, unspecified organism: Principal | ICD-10-CM | POA: Diagnosis present

## 2011-10-03 DIAGNOSIS — R0789 Other chest pain: Secondary | ICD-10-CM | POA: Insufficient documentation

## 2011-10-03 DIAGNOSIS — D571 Sickle-cell disease without crisis: Secondary | ICD-10-CM | POA: Diagnosis present

## 2011-10-03 DIAGNOSIS — I517 Cardiomegaly: Secondary | ICD-10-CM | POA: Insufficient documentation

## 2011-10-03 DIAGNOSIS — R079 Chest pain, unspecified: Secondary | ICD-10-CM | POA: Diagnosis present

## 2011-10-03 DIAGNOSIS — J189 Pneumonia, unspecified organism: Secondary | ICD-10-CM

## 2011-10-03 HISTORY — DX: Sickle-cell disease without crisis: D57.1

## 2011-10-03 HISTORY — DX: Hb-SS disease with acute chest syndrome: D57.01

## 2011-10-03 LAB — CBC WITH DIFFERENTIAL/PLATELET
Basophils Absolute: 0 K/uL (ref 0.0–0.1)
Basophils Relative: 0 % (ref 0–1)
Eosinophils Absolute: 0.1 10*3/uL (ref 0.0–0.7)
Eosinophils Relative: 1 % (ref 0–5)
HCT: 25.9 % — ABNORMAL LOW (ref 39.0–52.0)
Hemoglobin: 8.9 g/dL — ABNORMAL LOW (ref 13.0–17.0)
Lymphocytes Relative: 24 % (ref 12–46)
Lymphs Abs: 3.3 10*3/uL (ref 0.7–4.0)
MCH: 25.1 pg — ABNORMAL LOW (ref 26.0–34.0)
MCHC: 34.4 g/dL (ref 30.0–36.0)
MCV: 73 fL — ABNORMAL LOW (ref 78.0–100.0)
Monocytes Absolute: 2 10*3/uL — ABNORMAL HIGH (ref 0.1–1.0)
Monocytes Relative: 15 % — ABNORMAL HIGH (ref 3–12)
Neutro Abs: 8.2 K/uL — ABNORMAL HIGH (ref 1.7–7.7)
Neutrophils Relative %: 60 % (ref 43–77)
Platelets: 317 10*3/uL (ref 150–400)
RBC: 3.55 MIL/uL — ABNORMAL LOW (ref 4.22–5.81)
RDW: 25.3 % — ABNORMAL HIGH (ref 11.5–15.5)
WBC: 13.6 K/uL — ABNORMAL HIGH (ref 4.0–10.5)

## 2011-10-03 LAB — BASIC METABOLIC PANEL
BUN: 5 mg/dL — ABNORMAL LOW (ref 6–23)
Chloride: 106 mEq/L (ref 96–112)
Creatinine, Ser: 0.63 mg/dL (ref 0.50–1.35)
GFR calc Af Amer: >90 mL/min (ref >90)
Glucose, Bld: 92 mg/dL (ref 70–99)
Potassium: 3.9 mEq/L (ref 3.5–5.1)

## 2011-10-03 LAB — BASIC METABOLIC PANEL WITH GFR
CO2: 23 meq/L (ref 19–32)
Calcium: 8.8 mg/dL (ref 8.4–10.5)
GFR calc non Af Amer: >90 mL/min (ref >90)
Sodium: 140 meq/L (ref 135–145)

## 2011-10-03 LAB — SAMPLE TO BLOOD BANK

## 2011-10-03 LAB — TROPONIN I
Troponin I: <0.3 ng/mL (ref <0.30)
Troponin I: <0.3 ng/mL (ref <0.30)

## 2011-10-03 LAB — RETICULOCYTES
RBC.: 3.58 MIL/uL — ABNORMAL LOW (ref 4.22–5.81)
Retic Count, Absolute: 612.2 10*3/uL — ABNORMAL HIGH (ref 19.0–186.0)
Retic Ct Pct: 17.1 % — ABNORMAL HIGH (ref 0.4–3.1)

## 2011-10-03 LAB — HEPATIC FUNCTION PANEL
Albumin: 4.3 g/dL (ref 3.5–5.2)
Indirect Bilirubin: 6.7 mg/dL — ABNORMAL HIGH (ref 0.3–0.9)
Total Bilirubin: 7.1 mg/dL — ABNORMAL HIGH (ref 0.3–1.2)

## 2011-10-03 MED ORDER — HYDROXYZINE HCL 25 MG PO TABS
25.0000 mg | ORAL_TABLET | ORAL | Status: DC | PRN
  Filled 2011-10-03: qty 2

## 2011-10-03 MED ORDER — ALBUTEROL SULFATE (5 MG/ML) 0.5% IN NEBU
2.5000 mg | INHALATION_SOLUTION | Freq: Four times a day (QID) | RESPIRATORY_TRACT | Status: AC
  Administered 2011-10-03 – 2011-10-04 (×4): 2.5 mg via RESPIRATORY_TRACT
  Filled 2011-10-03 (×4): qty 0.5

## 2011-10-03 MED ORDER — KETOROLAC TROMETHAMINE 15 MG/ML IJ SOLN
15.0000 mg | Freq: Four times a day (QID) | INTRAMUSCULAR | Status: DC | PRN
  Administered 2011-10-03 – 2011-10-05 (×3): 15 mg via INTRAVENOUS
  Filled 2011-10-03 (×3): qty 1

## 2011-10-03 MED ORDER — OXYCODONE HCL 5 MG PO TABS
15.0000 mg | ORAL_TABLET | Freq: Four times a day (QID) | ORAL | Status: DC | PRN
  Administered 2011-10-05: 15 mg via ORAL
  Filled 2011-10-03 (×2): qty 3

## 2011-10-03 MED ORDER — SODIUM CHLORIDE 0.9 % IV BOLUS (SEPSIS)
500.0000 mL | Freq: Once | INTRAVENOUS | Status: AC
  Administered 2011-10-03: 500 mL via INTRAVENOUS

## 2011-10-03 MED ORDER — ACETAMINOPHEN 650 MG RE SUPP: 650.0000 mg | RECTAL | Status: DC | PRN

## 2011-10-03 MED ORDER — HYDROMORPHONE HCL PF 1 MG/ML IJ SOLN
2.0000 mg | Freq: Once | INTRAMUSCULAR | Status: AC
  Administered 2011-10-03: 2 mg via INTRAVENOUS
  Filled 2011-10-03 (×2): qty 2

## 2011-10-03 MED ORDER — HYDROMORPHONE HCL PF 2 MG/ML IJ SOLN
2.0000 mg | INTRAMUSCULAR | Status: DC | PRN
  Administered 2011-10-03 – 2011-10-05 (×6): 2 mg via INTRAVENOUS
  Filled 2011-10-03 (×6): qty 1

## 2011-10-03 MED ORDER — FOLIC ACID 1 MG PO TABS
1.0000 mg | ORAL_TABLET | Freq: Every day | ORAL | Status: DC
  Administered 2011-10-04 – 2011-10-05 (×2): 1 mg via ORAL
  Filled 2011-10-03 (×2): qty 1

## 2011-10-03 MED ORDER — ONDANSETRON HCL 4 MG/2ML IJ SOLN: 4.0000 mg | INTRAMUSCULAR | Status: DC | PRN

## 2011-10-03 MED ORDER — ALBUTEROL SULFATE (5 MG/ML) 0.5% IN NEBU
5.0000 mg | INHALATION_SOLUTION | Freq: Once | RESPIRATORY_TRACT | Status: AC
  Administered 2011-10-03: 5 mg via RESPIRATORY_TRACT
  Filled 2011-10-03: qty 1

## 2011-10-03 MED ORDER — HYDROCODONE-ACETAMINOPHEN 5-325 MG PO TABS: 1.0000 | ORAL_TABLET | ORAL | Status: DC | PRN

## 2011-10-03 MED ORDER — KETOROLAC TROMETHAMINE 30 MG/ML IJ SOLN
30.0000 mg | Freq: Once | INTRAMUSCULAR | Status: AC
  Administered 2011-10-03: 30 mg via INTRAVENOUS
  Filled 2011-10-03: qty 1

## 2011-10-03 MED ORDER — HYDROMORPHONE HCL PF 1 MG/ML IJ SOLN: 1.0000 mg | INTRAMUSCULAR | Status: DC | PRN

## 2011-10-03 MED ORDER — DEXTROSE 5 % IV SOLN
1.0000 g | INTRAVENOUS | Status: DC
  Administered 2011-10-03: 1 g via INTRAVENOUS
  Filled 2011-10-03 (×2): qty 10

## 2011-10-03 MED ORDER — DEXTROSE 5 % IV SOLN
500.0000 mg | Freq: Once | INTRAVENOUS | Status: AC
  Administered 2011-10-03: 500 mg via INTRAVENOUS
  Filled 2011-10-03: qty 500

## 2011-10-03 MED ORDER — ENOXAPARIN SODIUM 40 MG/0.4ML ~~LOC~~ SOLN
40.0000 mg | SUBCUTANEOUS | Status: DC
  Administered 2011-10-03: 40 mg via SUBCUTANEOUS
  Filled 2011-10-03 (×2): qty 0.4

## 2011-10-03 MED ORDER — ACETAMINOPHEN 325 MG PO TABS
650.0000 mg | ORAL_TABLET | ORAL | Status: DC | PRN
  Administered 2011-10-04: 650 mg via ORAL
  Filled 2011-10-03: qty 2

## 2011-10-03 MED ORDER — DEXTROSE 5 % IV SOLN
500.0000 mg | INTRAVENOUS | Status: DC
  Filled 2011-10-03: qty 500

## 2011-10-03 MED ORDER — HYDROMORPHONE HCL PF 1 MG/ML IJ SOLN
1.0000 mg | INTRAMUSCULAR | Status: DC | PRN
  Administered 2011-10-03: 1 mg via INTRAVENOUS
  Filled 2011-10-03: qty 1

## 2011-10-03 MED ORDER — ONDANSETRON HCL 4 MG PO TABS: 4.0000 mg | ORAL_TABLET | ORAL | Status: DC | PRN

## 2011-10-03 NOTE — ED Notes (Signed)
Attempted to call report again. Placed on hold.

## 2011-10-03 NOTE — ED Notes (Signed)
Patient reports pain in the right lung on inspiration since Monday which is causing him to breath shallow.   Patient denies cough or fevers.

## 2011-10-03 NOTE — ED Notes (Signed)
Resp called for breathing treatment.   

## 2011-10-03 NOTE — ED Notes (Signed)
Pt currently in no resp distress. Pt breathing without issue or effort. Pt sitting down playing on phone while discussing symptoms.

## 2011-10-03 NOTE — ED Notes (Signed)
Pt reports pain improved with medication. Pt reports pain 4/10.

## 2011-10-03 NOTE — ED Notes (Signed)
Pt reports right sided flank/rib pain starting on Monday. Pt reports he has had similar pain before with PNA. Pt reports last case of PNA was last year.  Pt reports he also has sickle cell pain and pain sometimes presents in this manner.  Pt reports increased cough, productive with yellow sputum. Pt reports pain is worse with deep breathing.  Pt denies fever.  Pt reports he had left over antibiotics from a previous hospital stay that he started taking last night- patient reports medication is a zpack.  Pt has been taking oxycodone for pain with last dose last night.  Pt has not had anything for pain today.

## 2011-10-03 NOTE — ED Notes (Signed)
Pt denies pain with palpation or movement.  Pt reports pain only with cough or deep breathing

## 2011-10-03 NOTE — ED Notes (Signed)
PT came in with with black Acer laptop and with a black evo cell phone

## 2011-10-03 NOTE — Progress Notes (Signed)
Kerry Hill, is a 31 y.o. male,   MRN: [id number]  -  DOB - 06/29/1980  Outpatient Primary MD for the patient is Billee Cashing, MD  in for    Chief Complaint  Patient presents with  . Pleurisy     Blood pressure 130/59, pulse 81, temperature 98.3 F (36.8 C), temperature source Oral, resp. rate 18, SpO2 93.00%.  Principal Problem:  *Bronchopneumonia Active Problems:  Sickle cell disease   31 yo hx sickle cell presents to  ED with right sided chest pain, hurts to take a deep breath or cough, has been coughing a little more, dry, in the past 1 day, CP been there for about 1 week. Pt reports h/o acute chest but denies receiving transfusion for it. Has Sickle ss disease, followed by Dr. Ronne Binning. He took himself off of folic acid and hydroxyurea because he didn't notice a difference. He takes 15 of oxycodone twice daily for chronic pain from sickle. No h/o MI, stroke. He reports normally with pain he gets priapism, not joint pains. No fevers, chills. Doesn't smoke. Feels SOB. No radiation of pain to back or lower abd or groin. Urine Has been dark.   Work up in Ed yields chest xray with  patchy bronchopneumonia suspected in the right lower lobe. Mild cardiomegaly, with interval increase in the heart size over the past 4 years. WC 13.6, RBC and HG seem at baseline for him.  On exam pt NAD non-toxic appearing. Hemodynamically stable. Afebrile. States pain much improved.   Given nebs, IV dilaudid and toradol as well as zithromax and rocephin in ED.   Given xray and ?hx of acute chest syndrome will admit to tele obs.

## 2011-10-03 NOTE — ED Provider Notes (Addendum)
History     CSN: [id number]  Arrival date & time 10/03/11  1037   First MD Initiated Contact with Patient 10/03/11 1148      Chief Complaint  Patient presents with  . Pleurisy    (Consider location/radiation/quality/duration/timing/severity/associated sxs/prior treatment) HPI Comments: Pt with right sided chest pain, hurts to take a deep breath or cough, has been coughing a little more, dry, in the past 1 day, CP been there for about 1 week.  Pt reports h/o acute chest but denies receiving transfusion for it.  Has Sickle ss disease, followed by Dr. Ronne Binning.  He took himself off of folic acid and hydroxyurea because he didn't notice a difference.  He takes 15 of oxycodone twice daily for chronic pain from sickle.  No h/o MI, stroke.  He reports normally with pain he gets priapism, not joint pains.  No fevers, chills.  Doesn't smoke.  Feels SOB.  No radiation of pain to back or lower abd or groin.  Urine  Has been dark.    The history is provided by the patient.    Past Medical History  Diagnosis Date  . Sickle cell disease     History reviewed. No pertinent past surgical history.  History reviewed. No pertinent family history.  History  Substance Use Topics  . Smoking status: Current Every Day Smoker    Types: Cigarettes  . Smokeless tobacco: Former Neurosurgeon  . Alcohol Use: No      Review of Systems  Constitutional: Negative for fever and chills.  Respiratory: Positive for cough and shortness of breath.   Cardiovascular: Positive for chest pain.  Gastrointestinal: Negative for nausea, vomiting, abdominal pain and diarrhea.  Genitourinary: Positive for hematuria and flank pain. Negative for dysuria, frequency and testicular pain.  Musculoskeletal: Negative for back pain.  Neurological: Negative for headaches.  All other systems reviewed and are negative.    Allergies  Review of patient's allergies indicates no known allergies.  Home Medications   Current Outpatient  Rx  Name Route Sig Dispense Refill  . AZITHROMYCIN 250 MG PO TABS Oral Take 250-500 mg by mouth daily. 2 tab on Day 1, 1 tab Days 2-5    . OXYCODONE HCL 15 MG PO TABS Oral Take 30 mg by mouth every 6 (six) hours as needed. PAIN      BP 130/59  Pulse 81  Temp 98.3 F (36.8 C) (Oral)  Resp 18  SpO2 93%  Physical Exam  Nursing note and vitals reviewed. Constitutional: He is oriented to person, place, and time. He appears well-developed and well-nourished.  HENT:  Head: Normocephalic and atraumatic.  Pulmonary/Chest: No respiratory distress. He has no wheezes. He has no rales. Chest wall is not dull to percussion. He exhibits tenderness. He exhibits no mass, no bony tenderness, no laceration, no crepitus, no edema and no retraction.  Abdominal: Soft. Normal appearance. He exhibits no distension. There is no tenderness. There is no rebound, no guarding and no CVA tenderness.  Neurological: He is alert and oriented to person, place, and time.  Skin: Skin is warm and dry. No rash noted.    ED Course  Procedures (including critical care time)  Labs Reviewed  CBC WITH DIFFERENTIAL - Abnormal; Notable for the following:    WBC 13.6 (*)  WHITE COUNT CONFIRMED ON SMEAR   RBC 3.55 (*)     Hemoglobin 8.9 (*)     HCT 25.9 (*)     MCV 73.0 (*)  MCH 25.1 (*)     RDW 25.3 (*)     Monocytes Relative 15 (*)     Neutro Abs 8.2 (*)     Monocytes Absolute 2.0 (*)     All other components within normal limits  BASIC METABOLIC PANEL - Abnormal; Notable for the following:    BUN 5 (*)     All other components within normal limits  RETICULOCYTES - Abnormal; Notable for the following:    Retic Ct Pct 17.1 (*)  RESULTS CONFIRMED BY MANUAL DILUTION   RBC. 3.58 (*)     Retic Count, Manual 612.2 (*)     All other components within normal limits  TROPONIN I  SAMPLE TO BLOOD BANK   Dg Chest 2 View  10/03/2011  *RADIOLOGY REPORT*  Clinical Data: Cough.  Chest pain.  Sickle cell crisis.  CHEST - 2  VIEW  Comparison: Two-view chest x-ray 05/15/2011, 07/08/2009, 08/26/2007.  Findings: Cardiac silhouette enlarged, with slight interval increase in size over the 4-year interval.  Hilar and mediastinal contours otherwise unremarkable.  Pulmonary vascularity normal. Patchy opacities in the right lower lobe.  Lungs otherwise clear. No pleural effusions.  Thoracic scoliosis convex right and mild changes of sickle osteopathy.  IMPRESSION: Patchy bronchopneumonia suspected in the right lower lobe.  Mild cardiomegaly, with interval increase in the heart size over the past 4 years.   Original Report Authenticated By: Arnell Sieving, M.D.      1. Community acquired pneumonia   2. Sickle cell anemia     RA sat of 93% is adequate, but low normal.   3:22 PM Spoke to [facility] with [facility] who will admit, will put in under obs status to telemetry.     MDM  Pt with pleuritic type pain, no ischemia on ECG, CXR shows patchy airspace disease and pt has been coughing more in past 2 days.  Given h/o sickle cell, I recommend admission considering RA sat is marginal at 93 and also has h/o acute chest syndrome.  WBC is 13.6, retic count high at 17.1.         Gavin Pound. Oletta Lamas, MD 10/03/11 1516  Gavin Pound. Keeghan Mcintire, MD 10/03/11 [phone]

## 2011-10-03 NOTE — H&P (Addendum)
[facility] History and Physical  Arber Wiemers ZOX:[id number] DOB: Mar 20, 1980 DOA: 10/03/2011  Referring physician: Dr. Oletta Lamas PCP: Billee Cashing, MD   Chief Complaint: Pleuritic chest pain and shortness of breath  HPI: Kerry Hill is a 31 y.o. male with past medical history significant for sickle cell disease and history of acute chest syndrome in the past. Come to the hospital complaining of right-sided chest pleuritic pain and also increase shortness of breath. Patient reports that he symptoms has been present for about a week prior to admission, but over the last 2 days has steadily worsened to the point that he decided to come for further evaluation. In the emergency department patient was found with elevated wbc's, elevated reticulocyte count, chest x-ray with patchy infiltrates of his right lung with high concerns for pneumonia. Patient oxygen saturation was borderline normal at 92% on room air. Due to the past history of acute chest syndrome [facility] has been called to admit the patient for observation and to provide further evaluation and treatment.   Review of Systems: Negative except as otherwise mentioned on history of present illness. Past Medical History  Diagnosis Date  . Sickle cell disease   . Acute chest syndrome     ? in past   Past Surgical History  Procedure Date  . Appendectomy    Social History:  reports that he quit smoking about 3 years ago. His smoking use included Cigarettes. He has never used smokeless tobacco. He reports that he drinks alcohol. He reports that he does not use illicit drugs. Patient came from home and is able to perform all his activities of daily living without any assistance.  No Known Allergies  History reviewed. No pertinent family history. per patient no pertinent past medical history of importance of his family members.  Prior to Admission medications   Medication Sig Start Date End Date Taking? Authorizing Provider    azithromycin (ZITHROMAX) 250 MG tablet Take 250-500 mg by mouth daily. 2 tab on Day 1, 1 tab Days 2-5   Yes Historical Provider, MD  oxyCODONE (ROXICODONE) 15 MG immediate release tablet Take 30 mg by mouth every 6 (six) hours as needed. PAIN   Yes Historical Provider, MD   Physical Exam: Filed Vitals:   10/03/11 1044 10/03/11 1548 10/03/11 1653 10/03/11 1712  BP: 130/59 143/63  134/70  Pulse: 81 83 97 72  Temp: 98.3 F (36.8 C) 98.1 F (36.7 C)  98.4 F (36.9 C)  TempSrc: Oral Oral  Oral  Resp: 18 20 19 20   SpO2: 93% 94% 95% 97%     General:  Mild distress secondary to pain, able to speak in full sentences.  Eyes: No icterus, no nystagmus, PERRLA, extraocular muscles intact  ENT: Moist mucous membranes, no erythema or exudate inside his mouth, ears and nostrils without any discharges.  Neck: Supple, no thyromegaly, no bruits or JVD  Cardiovascular: Regular rate and rhythm, no murmurs, no gallops or rubs.  Respiratory: Scattered rhonchi (right more than left), no crackles, no wheezing  Abdomen: Soft, nontender, nondistended, positive bowel sounds.  Skin: No rash or petechiae.  Musculoskeletal: Full range of motion, no joint swelling or erythema. Patient complaining of right side chest and back pain.  Psychiatric: No suicidal ideation no hallucinations.  Neurologic: Alert, awake and oriented x3, Cranial nerve intact, muscle strength 5 out of 5 bilaterally and symmetrically; no focal motor or sensory deficit appreciated on exam  Labs on Admission:  Basic Metabolic Panel:  Lab 10/03/11 8119  10/03/11 1209  NA 140 --  K 3.9 --  CL 106 --  CO2 23 --  GLUCOSE 92 --  BUN 5* --  CREATININE 0.63 --  CALCIUM 8.8 --  MG -- 2.1  PHOS -- --   Liver Function Tests:  Lab 10/03/11 1209  AST 42*  ALT 17  ALKPHOS 57  BILITOT 7.1*  PROT 8.2  ALBUMIN 4.3   CBC:  Lab 10/03/11 1215  WBC 13.6*  NEUTROABS 8.2*  HGB 8.9*  HCT 25.9*  MCV 73.0*  PLT 317   Cardiac  Enzymes:  Lab 10/03/11 1215  CKTOTAL --  CKMB --  CKMBINDEX --  TROPONINI <0.30    Radiological Exams on Admission: Dg Chest 2 View  10/03/2011  *RADIOLOGY REPORT*  Clinical Data: Cough.  Chest pain.  Sickle cell crisis.  CHEST - 2 VIEW  Comparison: Two-view chest x-ray 05/15/2011, 07/08/2009, 08/26/2007.  Findings: Cardiac silhouette enlarged, with slight interval increase in size over the 4-year interval.  Hilar and mediastinal contours otherwise unremarkable.  Pulmonary vascularity normal. Patchy opacities in the right lower lobe.  Lungs otherwise clear. No pleural effusions.  Thoracic scoliosis convex right and mild changes of sickle osteopathy.  IMPRESSION: Patchy bronchopneumonia suspected in the right lower lobe.  Mild cardiomegaly, with interval increase in the heart size over the past 4 years.   Original Report Authenticated By: Arnell Sieving, M.D.     EKG: Regular rate and rate in, no ST segment elevation or depression. No acute signs of ischemia.  Assessment/Plan 1-community acquired pneumonia: Patient will be admitted, started on Rocephin and Zithromax; when necessary nebulizer treatments and incentive spirometer. Incentive spirometer, Legionella antigen and also a strep antigen in urine has been order. Will follow culture results and provide supportive care.  2-chest pain: Most likely secondary to patient pneumonia and mild sickle cell disease. Patient will be admitted to telemetry, will cycle his cardiac enzymes to be oral and follow his symptoms. At this moment his hemoglobin level, lack hypoxia and also reticulocytes counts are demonstrating just mild exacerbation of his sickle cell and not a frank acute chest syndrome. Will follow his clinical involution and if needed will perform exchange transfusion.   3-Sickle cell disease: Patient will receive IV fluids, pain medication, folic acid and supportive care. At this moment will also provide treatment of the triggering cause  for his sickle cell crisis which is the infection in his lungs. As mentioned above concern will be for acute chest syndrome development and if this occurred will proceed with x-ray interpretation.  DVT: Lovenox.   Code Status: Full code Family Communication: No family at bedside Disposition Plan: Home when medically stable  Time spent: More than 30 minutes  Sabrine Patchen [facility] Pager [phone]  If 7PM-7AM, please contact night-coverage [email or web address] Password [facility] 10/03/2011, 6:49 PM

## 2011-10-03 NOTE — ED Notes (Signed)
Attempted to call report, nurse unable to come to phone

## 2011-10-04 LAB — TROPONIN I: Troponin I: <0.3 ng/mL (ref <0.30)

## 2011-10-04 LAB — STREP PNEUMONIAE URINARY ANTIGEN: Strep Pneumo Urinary Antigen: NEGATIVE

## 2011-10-04 MED ORDER — OXYCODONE HCL 10 MG PO TB12
10.0000 mg | ORAL_TABLET | Freq: Two times a day (BID) | ORAL | Status: DC
  Administered 2011-10-04: 10 mg via ORAL
  Filled 2011-10-04 (×2): qty 1

## 2011-10-04 MED ORDER — MOXIFLOXACIN HCL 400 MG PO TABS
400.0000 mg | ORAL_TABLET | Freq: Every day | ORAL | Status: DC
  Administered 2011-10-04: 400 mg via ORAL
  Filled 2011-10-04 (×2): qty 1

## 2011-10-04 MED ORDER — ENOXAPARIN SODIUM 60 MG/0.6ML ~~LOC~~ SOLN
50.0000 mg | SUBCUTANEOUS | Status: DC
  Administered 2011-10-04: 50 mg via SUBCUTANEOUS
  Filled 2011-10-04: qty 1
  Filled 2011-10-04: qty 0.6

## 2011-10-04 MED ORDER — SODIUM CHLORIDE 0.9 % IJ SOLN
3.0000 mL | INTRAMUSCULAR | Status: DC | PRN
  Administered 2011-10-04 – 2011-10-05 (×2): 3 mL via INTRAVENOUS

## 2011-10-04 NOTE — Progress Notes (Signed)
[facility] PROGRESS NOTE  Kerry Hill AVW:[id number] DOB: 1980-04-29 DOA: 10/03/2011 PCP: Billee Cashing, MD  Assessment/Plan: 1-community acquired pneumonia: Patient slowly improving. At this moment without any further fevers or chills; denies cough. Antibiotics will be changed by mouth using Avelox in anticipation to discharge over the next 24-48 hours. Continue supportive care.   2-chest pain: Most likely secondary to patient pneumonia and mild sickle cell disease. Due to the lack of hypoxia patient chest pain is no correlating with acute chest syndrome. At this moment will adjust his pain medications for better control of his sickle cell crisis and to try to use by mouth analgesics in anticipation for discharge. Will continue folate acid.  3-Sickle cell disease: Patient had received fluid resuscitation; will continue pain medications, folic acid and supportive care. At this moment will also provide treatment of the triggering cause for his sickle cell crisis which is the infection in his lungs. Will try to transition and use as much as possible analgesics by mouth in anticipation for discharge over the next 24-48 hours. Patient overall feeling a lot better. Reticulocytes, CBC, basic metabolic panel to be recheck in the morning.   Code Status: Full Family Communication: none at bedside Disposition Plan: home when medically stable   Brief narrative: Kerry Hill is a 31 y.o. male with past medical history significant for sickle cell disease and history of acute chest syndrome in the past. Come to the hospital complaining of right-sided chest pleuritic pain and also increase shortness of breath. Patient reports that he symptoms has been present for about a week prior to admission, but over the last 2 days has steadily worsened to the point that he decided to come for further evaluation. In the emergency department patient was found with elevated wbc's, elevated reticulocyte count, chest  x-ray with patchy infiltrates of his right lung with high concerns for pneumonia. Patient oxygen saturation was borderline normal at 92% on room air. Due to the past history of acute chest syndrome [facility] has been called to admit the patient for observation and to provide further evaluation and treatment.   Antibiotics:  Rocephin and Zithromax 10/03/11 to 10/04/2011  Avelox by mouth 10/04/2011  HPI/Subjective: Afebrile; denies shortness of breath, nausea/vomiting or abdominal pain. Oxygen saturation 94-96% on room air. Patient is still with some right chest/back discomfort, but overall feeling better.  Objective: Filed Vitals:   10/04/11 0507 10/04/11 0923 10/04/11 1443 10/04/11 1458  BP: 135/72   140/58  Pulse: 68   87  Temp: 98.4 F (36.9 C)   98.4 F (36.9 C)  TempSrc: Oral   Oral  Resp: 18   20  Height:      Weight:      SpO2: 98% 97% 94% 93%    Intake/Output Summary (Last 24 hours) at 10/04/11 1600 Last data filed at 10/04/11 1501  Gross per 24 hour  Intake    880 ml  Output   2300 ml  Net  -1420 ml   Filed Weights   10/03/11 1852  Weight: 103.239 kg (227 lb 9.6 oz)    Exam:   General:  Mild distress secondary to right chest discomfort, no shortness of breath.  Cardiovascular: Regular rate and rhythm, no murmurs, no rubs or gallops.  Respiratory: No wheezing, no crackles; scattered rhonchi (right more than left).  Abdomen: Soft, nontender, nondistended, positive bowel sounds.  Neuro: Alert, awake and oriented x3; cranial nerve intact, muscle strength 5 out of 5 bilaterally and symmetrically. No focal  deficit.  Data Reviewed: Basic Metabolic Panel:  Lab 10/03/11 1610 10/03/11 1209  NA 140 --  K 3.9 --  CL 106 --  CO2 23 --  GLUCOSE 92 --  BUN 5* --  CREATININE 0.63 --  CALCIUM 8.8 --  MG -- 2.1  PHOS -- --   Liver Function Tests:  Lab 10/03/11 1209  AST 42*  ALT 17  ALKPHOS 57  BILITOT 7.1*  PROT 8.2  ALBUMIN 4.3   CBC:  Lab  10/03/11 1215  WBC 13.6*  NEUTROABS 8.2*  HGB 8.9*  HCT 25.9*  MCV 73.0*  PLT 317   Cardiac Enzymes:  Lab 10/04/11 0454 10/03/11 2230 10/03/11 1817 10/03/11 1215  CKTOTAL -- -- -- --  CKMB -- -- -- --  CKMBINDEX -- -- -- --  TROPONINI <0.30 <0.30 <0.30 <0.30    Studies: Dg Chest 2 View  10/03/2011  *RADIOLOGY REPORT*  Clinical Data: Cough.  Chest pain.  Sickle cell crisis.  CHEST - 2 VIEW  Comparison: Two-view chest x-ray 05/15/2011, 07/08/2009, 08/26/2007.  Findings: Cardiac silhouette enlarged, with slight interval increase in size over the 4-year interval.  Hilar and mediastinal contours otherwise unremarkable.  Pulmonary vascularity normal. Patchy opacities in the right lower lobe.  Lungs otherwise clear. No pleural effusions.  Thoracic scoliosis convex right and mild changes of sickle osteopathy.  IMPRESSION: Patchy bronchopneumonia suspected in the right lower lobe.  Mild cardiomegaly, with interval increase in the heart size over the past 4 years.   Original Report Authenticated By: Arnell Sieving, M.D.     Scheduled Meds:   . albuterol  2.5 mg Nebulization Q6H  . azithromycin  500 mg Intravenous Once  . enoxaparin  50 mg Subcutaneous Q24H  . folic acid  1 mg Oral Daily  . moxifloxacin  400 mg Oral Q2000  . oxyCODONE  10 mg Oral Q12H  . DISCONTD: azithromycin  500 mg Intravenous Q24H  . DISCONTD: cefTRIAXone (ROCEPHIN)  IV  1 g Intravenous Q24H  . DISCONTD: enoxaparin  40 mg Subcutaneous Q24H     Time spent: More than 25 minutes    Torunn Chancellor  [facility] Pager [phone]. If 8PM-8AM, please contact night-coverage at [email or web address], password [facility] 10/04/2011, 4:00 PM  LOS: 1 day

## 2011-10-05 DIAGNOSIS — J189 Pneumonia, unspecified organism: Secondary | ICD-10-CM

## 2011-10-05 DIAGNOSIS — D571 Sickle-cell disease without crisis: Secondary | ICD-10-CM

## 2011-10-05 DIAGNOSIS — R079 Chest pain, unspecified: Secondary | ICD-10-CM

## 2011-10-05 LAB — CBC
HCT: 22.7 % — ABNORMAL LOW (ref 39.0–52.0)
MCHC: 34.8 g/dL (ref 30.0–36.0)
RDW: 24.6 % — ABNORMAL HIGH (ref 11.5–15.5)
WBC: 15.9 10*3/uL — ABNORMAL HIGH (ref 4.0–10.5)

## 2011-10-05 LAB — BASIC METABOLIC PANEL
BUN: 6 mg/dL (ref 6–23)
Chloride: 101 mEq/L (ref 96–112)
GFR calc Af Amer: >90 mL/min (ref >90)
GFR calc non Af Amer: >90 mL/min (ref >90)
Potassium: 3.5 mEq/L (ref 3.5–5.1)
Sodium: 135 mEq/L (ref 135–145)

## 2011-10-05 LAB — RETICULOCYTES
RBC.: 3.09 MIL/uL — ABNORMAL LOW (ref 4.22–5.81)
Retic Count, Absolute: 590.2 10*3/uL — ABNORMAL HIGH (ref 19.0–186.0)
Retic Ct Pct: 19.1 % — ABNORMAL HIGH (ref 0.4–3.1)

## 2011-10-05 MED ORDER — MOXIFLOXACIN HCL 400 MG PO TABS: 400.0000 mg | ORAL_TABLET | Freq: Every day | ORAL | Status: AC

## 2011-10-05 MED ORDER — OXYCODONE HCL 30 MG PO TABS: 30.0000 mg | ORAL_TABLET | Freq: Four times a day (QID) | ORAL | Status: DC | PRN

## 2011-10-05 MED ORDER — GUAIFENESIN ER 600 MG PO TB12: 600.0000 mg | ORAL_TABLET | Freq: Two times a day (BID) | ORAL | Status: DC

## 2011-10-05 MED ORDER — ACETAMINOPHEN 325 MG PO TABS: 650.0000 mg | ORAL_TABLET | Freq: Four times a day (QID) | ORAL | Status: DC | PRN

## 2011-10-05 MED ORDER — OXYCODONE HCL 15 MG PO TB12: 15.0000 mg | ORAL_TABLET | Freq: Two times a day (BID) | ORAL | Status: DC

## 2011-10-05 MED ORDER — FOLIC ACID 1 MG PO TABS: 1.0000 mg | ORAL_TABLET | Freq: Every day | ORAL | Status: DC

## 2011-10-05 NOTE — Progress Notes (Signed)
Pt discharged to home provided discharge instructions and prescriptions along with handouts. Pt verbalized understanding of discharge information. Pt stable. Pt transported by [facility] IV removed and documented. Annitta Needs, RN

## 2011-10-05 NOTE — Discharge Summary (Signed)
Physician Discharge Summary  Kerry Hill ION:[id number] DOB: 11-14-1980 DOA: 10/03/2011  PCP: Billee Cashing, MD  Admit date: 10/03/2011 Discharge date: 10/05/2011  Recommendations for Outpatient Follow-up:  1. PCP follow up in 10 days (Follow resolution of patient symptoms. Address long term pain regimen and discussed about restarting hydroxyurea. Patient to follow with sickle cell disease specialist at [facility])  Discharge Diagnoses:  Principal Problem:  *Bronchopneumonia Active Problems:  Sickle cell disease  Chest pain   Discharge Condition: stable and improved. Patient will be discharged home and has been instructed to follow medications and discharge orders as prescribed.  Diet recommendation: regular diet  Filed Weights   10/03/11 1852  Weight: 103.239 kg (227 lb 9.6 oz)    History of present illness:  Kerry Hill is a 31 y.o. male with past medical history significant for sickle cell disease and history of acute chest syndrome in the past. Come to the hospital complaining of right-sided chest pleuritic pain and also increase shortness of breath. Patient reports that he symptoms has been present for about a week prior to admission, but over the last 2 days has steadily worsened to the point that he decided to come for further evaluation. In the emergency department patient was found with elevated wbc's, elevated reticulocyte count, chest x-ray with patchy infiltrates of his right lung with high concerns for pneumonia. Patient oxygen saturation was borderline normal at 92% on room air. Due to the past history of acute chest syndrome [facility] has been called to admit the patient for observation and to provide further evaluation and treatment.   Hospital Course:  1-community acquired pneumonia: Patient continue improving. At this moment without any chills and Jos mild low grade fever 10/04/11; slight cough present. Will discharge with instructions to finish avelox daily for 8  more days, to use PRN tylenol for comfort and antipyretics. Instructed to keep himself hydrated, to use mucinex as instructed to help with expectoration and to follow with PCP in 10 days.  2-chest pain: Most likely secondary to patient pneumonia and mild sickle cell crisis. Due to the lack of hypoxia patient chest pain is no correlating with acute chest syndrome. At this moment will adjust his pain medications for better control of his sickle cell crisis and continue folic acid. Patient will follow with PCP and sickle cell disease specialist at [facility].  3-Sickle cell disease: Patient had received fluid resuscitation and pain meds; Hgb stable and no requiring transfusion at this time. Will encourage to use folic acid and during follow up with PCP or sickle cell disease discussed about possibility of restarting hydroxyurea.   Consultations:  none  Discharge Exam: Filed Vitals:   10/04/11 1458 10/04/11 1945 10/04/11 2215 10/05/11 0611  BP: 140/58  134/56 115/59  Pulse: 87  76 64  Temp: 98.4 F (36.9 C) 101.6 F (38.7 C) 98.8 F (37.1 C) 98.4 F (36.9 C)  TempSrc: Oral Oral Oral Oral  Resp: 20  18 16   Height:      Weight:      SpO2: 93%  94% 96%    General: No acute distress, good oxygen saturation on room air, still with mild discomfort of his right back 1 coughing. Cardiovascular: Regular rate, no murmurs, no gallops or rubs. Respiratory: scattered rhonchi; otherwise CTA. Extremities: no edema, no cyanosis or clubbing. Neuro: non focal deficit.  Discharge Instructions  Discharge Orders    Future Orders Please Complete By Expires   Discharge instructions      Comments:   -  Take medications as prescribed. -Arrange followup visit with your PCP over the next 10 days. -Keep youself well hydrated. -Arrange followup at [facility] with your sickle cell specialist for further evaluation and treatment of your disease.       Medication List     As of 10/05/2011 11:56 AM    STOP taking  these medications         azithromycin 250 MG tablet   Commonly known as: ZITHROMAX      TAKE these medications         acetaminophen 325 MG tablet   Commonly known as: TYLENOL   Take 2 tablets (650 mg total) by mouth every 6 (six) hours as needed for fever (mild pain).      folic acid 1 MG tablet   Commonly known as: FOLVITE   Take 1 tablet (1 mg total) by mouth daily.      guaiFENesin 600 MG 12 hr tablet   Commonly known as: MUCINEX   Take 1 tablet (600 mg total) by mouth 2 (two) times daily.      moxifloxacin 400 MG tablet   Commonly known as: AVELOX   Take 1 tablet (400 mg total) by mouth daily at 6 PM.      oxyCODONE 15 MG Tb12   Commonly known as: OXYCONTIN   Take 1 tablet (15 mg total) by mouth every 12 (twelve) hours.      oxycodone 30 MG immediate release tablet   Commonly known as: ROXICODONE   Take 1 tablet (30 mg total) by mouth every 6 (six) hours as needed (breakthrough). PAIN           Follow-up Information    Follow up with Billee Cashing, MD. In 10 days.   Contact information:   [address] [place] [address] [phone]           The results of significant diagnostics from this hospitalization (including imaging, microbiology, ancillary and laboratory) are listed below for reference.    Significant Diagnostic Studies: Dg Chest 2 View  10/03/2011  *RADIOLOGY REPORT*  Clinical Data: Cough.  Chest pain.  Sickle cell crisis.  CHEST - 2 VIEW  Comparison: Two-view chest x-ray 05/15/2011, 07/08/2009, 08/26/2007.  Findings: Cardiac silhouette enlarged, with slight interval increase in size over the 4-year interval.  Hilar and mediastinal contours otherwise unremarkable.  Pulmonary vascularity normal. Patchy opacities in the right lower lobe.  Lungs otherwise clear. No pleural effusions.  Thoracic scoliosis convex right and mild changes of sickle osteopathy.  IMPRESSION: Patchy bronchopneumonia suspected in the right lower lobe.  Mild  cardiomegaly, with interval increase in the heart size over the past 4 years.   Original Report Authenticated By: Arnell Sieving, M.D.     Labs: Basic Metabolic Panel:  Lab 10/05/11 2130 10/03/11 1215 10/03/11 1209  NA 135 140 --  K 3.5 3.9 --  CL 101 106 --  CO2 27 23 --  GLUCOSE 108* 92 --  BUN 6 5* --  CREATININE 0.67 0.63 --  CALCIUM 8.4 8.8 --  MG -- -- 2.1  PHOS -- -- --   Liver Function Tests:  Lab 10/03/11 1209  AST 42*  ALT 17  ALKPHOS 57  BILITOT 7.1*  PROT 8.2  ALBUMIN 4.3   CBC:  Lab 10/05/11 0424 10/03/11 1215  WBC 15.9* 13.6*  NEUTROABS -- 8.2*  HGB 7.9* 8.9*  HCT 22.7* 25.9*  MCV 73.5* 73.0*  PLT 305 317   Cardiac Enzymes:  Lab 10/04/11 0454 10/03/11 2230 10/03/11 1817 10/03/11 1215  CKTOTAL -- -- -- --  CKMB -- -- -- --  CKMBINDEX -- -- -- --  TROPONINI <0.30 <0.30 <0.30 <0.30    Time coordinating discharge: >30 minutes  Signed:  Ronniesha Seibold  [facility] 10/05/2011, 11:56 AM

## 2011-10-06 LAB — LEGIONELLA ANTIGEN, URINE

## 2012-03-29 ENCOUNTER — Emergency Department (HOSPITAL_COMMUNITY): Payer: Medicaid Other

## 2012-03-29 ENCOUNTER — Inpatient Hospital Stay (HOSPITAL_COMMUNITY): Payer: Medicaid Other

## 2012-03-29 ENCOUNTER — Inpatient Hospital Stay (HOSPITAL_COMMUNITY)
Admission: EM | Admit: 2012-03-29 | Discharge: 2012-03-31 | DRG: 812 | Disposition: A | Discharge status: Home / Self Care | Payer: Medicaid Other | Attending: Internal Medicine | Admitting: Internal Medicine

## 2012-03-29 ENCOUNTER — Encounter (HOSPITAL_COMMUNITY): Payer: Self-pay | Admitting: Emergency Medicine

## 2012-03-29 DIAGNOSIS — A5903 Trichomonal cystitis and urethritis: Secondary | ICD-10-CM | POA: Diagnosis present

## 2012-03-29 DIAGNOSIS — Z87891 Personal history of nicotine dependence: Secondary | ICD-10-CM

## 2012-03-29 DIAGNOSIS — D571 Sickle-cell disease without crisis: Secondary | ICD-10-CM

## 2012-03-29 DIAGNOSIS — J180 Bronchopneumonia, unspecified organism: Secondary | ICD-10-CM

## 2012-03-29 DIAGNOSIS — D5701 Hb-SS disease with acute chest syndrome: Secondary | ICD-10-CM

## 2012-03-29 DIAGNOSIS — Z79899 Other long term (current) drug therapy: Secondary | ICD-10-CM

## 2012-03-29 DIAGNOSIS — D5700 Hb-SS disease with crisis, unspecified: Principal | ICD-10-CM

## 2012-03-29 DIAGNOSIS — R079 Chest pain, unspecified: Secondary | ICD-10-CM

## 2012-03-29 DIAGNOSIS — R918 Other nonspecific abnormal finding of lung field: Secondary | ICD-10-CM | POA: Diagnosis present

## 2012-03-29 DIAGNOSIS — A599 Trichomoniasis, unspecified: Secondary | ICD-10-CM

## 2012-03-29 DIAGNOSIS — D72829 Elevated white blood cell count, unspecified: Secondary | ICD-10-CM | POA: Diagnosis present

## 2012-03-29 LAB — BASIC METABOLIC PANEL
BUN: 6 mg/dL (ref 6–23)
Chloride: 103 mEq/L (ref 96–112)
GFR calc Af Amer: >90 mL/min (ref >90)
GFR calc non Af Amer: >90 mL/min (ref >90)
Potassium: 3.4 mEq/L — ABNORMAL LOW (ref 3.5–5.1)
Sodium: 138 mEq/L (ref 135–145)

## 2012-03-29 LAB — CBC
MCHC: 33.5 g/dL (ref 30.0–36.0)
Platelets: 314 10*3/uL (ref 150–400)
RDW: 25 % — ABNORMAL HIGH (ref 11.5–15.5)
WBC: 10.6 10*3/uL — ABNORMAL HIGH (ref 4.0–10.5)

## 2012-03-29 LAB — RAPID URINE DRUG SCREEN, HOSP PERFORMED
Amphetamines: NOT DETECTED
Benzodiazepines: NOT DETECTED
Opiates: NOT DETECTED
Tetrahydrocannabinol: NOT DETECTED

## 2012-03-29 LAB — URINALYSIS, ROUTINE W REFLEX MICROSCOPIC
Ketones, ur: NEGATIVE mg/dL
Nitrite: NEGATIVE
Specific Gravity, Urine: 1.015 (ref 1.005–1.030)
Urobilinogen, UA: 4 mg/dL — ABNORMAL HIGH (ref 0.0–1.0)
pH: 5.5 (ref 5.0–8.0)

## 2012-03-29 LAB — RETICULOCYTES
RBC.: 3.8 MIL/uL — ABNORMAL LOW (ref 4.22–5.81)
Retic Count, Absolute: 573.8 10*3/uL — ABNORMAL HIGH (ref 19.0–186.0)
Retic Ct Pct: 15.1 % — ABNORMAL HIGH (ref 0.4–3.1)

## 2012-03-29 LAB — HEPATIC FUNCTION PANEL
Albumin: 3.7 g/dL (ref 3.5–5.2)
Indirect Bilirubin: 6.5 mg/dL — ABNORMAL HIGH (ref 0.3–0.9)
Total Bilirubin: 7 mg/dL — ABNORMAL HIGH (ref 0.3–1.2)
Total Protein: 7.4 g/dL (ref 6.0–8.3)

## 2012-03-29 LAB — TROPONIN I: Troponin I: <0.3 ng/mL (ref <0.30)

## 2012-03-29 LAB — URINE MICROSCOPIC-ADD ON

## 2012-03-29 LAB — LACTATE DEHYDROGENASE: LDH: 447 U/L — ABNORMAL HIGH (ref 94–250)

## 2012-03-29 MED ORDER — OXYCODONE HCL ER 15 MG PO T12A
15.0000 mg | EXTENDED_RELEASE_TABLET | Freq: Two times a day (BID) | ORAL | Status: DC
  Administered 2012-03-29 – 2012-03-31 (×4): 15 mg via ORAL
  Filled 2012-03-29 (×4): qty 1

## 2012-03-29 MED ORDER — OXYCODONE HCL 10 MG PO TB12: 15.0000 mg | ORAL_TABLET | Freq: Two times a day (BID) | ORAL | Status: DC

## 2012-03-29 MED ORDER — ACETAMINOPHEN 325 MG PO TABS: 650.0000 mg | ORAL_TABLET | Freq: Four times a day (QID) | ORAL | Status: DC | PRN

## 2012-03-29 MED ORDER — HYDROMORPHONE HCL PF 2 MG/ML IJ SOLN
3.0000 mg | INTRAMUSCULAR | Status: DC | PRN
  Administered 2012-03-29 – 2012-03-31 (×8): 3 mg via INTRAVENOUS
  Filled 2012-03-29 (×6): qty 2
  Filled 2012-03-29: qty 1
  Filled 2012-03-29 (×2): qty 2

## 2012-03-29 MED ORDER — CEFTRIAXONE SODIUM 1 G IJ SOLR
1.0000 g | Freq: Once | INTRAMUSCULAR | Status: AC
  Administered 2012-03-29: 1 g via INTRAVENOUS
  Filled 2012-03-29: qty 10

## 2012-03-29 MED ORDER — ONDANSETRON HCL 4 MG/2ML IJ SOLN
4.0000 mg | Freq: Once | INTRAMUSCULAR | Status: AC
  Administered 2012-03-29: 4 mg via INTRAVENOUS
  Filled 2012-03-29: qty 2

## 2012-03-29 MED ORDER — KETOROLAC TROMETHAMINE 30 MG/ML IJ SOLN
30.0000 mg | Freq: Once | INTRAMUSCULAR | Status: AC
  Administered 2012-03-30: 30 mg via INTRAVENOUS
  Filled 2012-03-29: qty 1

## 2012-03-29 MED ORDER — ACETAMINOPHEN 650 MG RE SUPP: 650.0000 mg | Freq: Four times a day (QID) | RECTAL | Status: DC | PRN

## 2012-03-29 MED ORDER — DEXTROSE 5 % IV SOLN
500.0000 mg | Freq: Once | INTRAVENOUS | Status: AC
  Administered 2012-03-29: 500 mg via INTRAVENOUS
  Filled 2012-03-29: qty 500

## 2012-03-29 MED ORDER — METRONIDAZOLE 500 MG PO TABS
2000.0000 mg | ORAL_TABLET | Freq: Once | ORAL | Status: AC
  Administered 2012-03-29: 2000 mg via ORAL
  Filled 2012-03-29: qty 4

## 2012-03-29 MED ORDER — HYDROMORPHONE HCL PF 2 MG/ML IJ SOLN
2.0000 mg | Freq: Once | INTRAMUSCULAR | Status: AC
  Administered 2012-03-29: 2 mg via INTRAVENOUS
  Filled 2012-03-29: qty 1

## 2012-03-29 MED ORDER — SODIUM CHLORIDE 0.9 % IJ SOLN: 3.0000 mL | Freq: Two times a day (BID) | INTRAMUSCULAR | Status: DC

## 2012-03-29 MED ORDER — ENOXAPARIN SODIUM 40 MG/0.4ML ~~LOC~~ SOLN: 40.0000 mg | SUBCUTANEOUS | Status: DC

## 2012-03-29 MED ORDER — SODIUM CHLORIDE 0.9 % IV SOLN
INTRAVENOUS | Status: DC
  Administered 2012-03-29: 21:00:00 via INTRAVENOUS
  Filled 2012-03-29 (×2): qty 1000

## 2012-03-29 MED ORDER — ENOXAPARIN SODIUM 40 MG/0.4ML ~~LOC~~ SOLN
40.0000 mg | SUBCUTANEOUS | Status: DC
  Administered 2012-03-29 – 2012-03-30 (×2): 40 mg via SUBCUTANEOUS
  Filled 2012-03-29 (×3): qty 0.4

## 2012-03-29 NOTE — Progress Notes (Signed)
[facility] ED CM noted CM consult for --Pt states he cannot afford any medications that are not covered by his medicaid. Cm spoke with pt who confirms he is able to get all of his medications generally without issues cm signing off

## 2012-03-29 NOTE — H&P (Signed)
[facility] History and Physical  Kerry Hill ZOX:[id number] DOB: [birth year] DOA: 03/29/2012   PCP: Billee Cashing, MD   Chief Complaint: Chest pain  HPI:  32 year old male with a history of sickle cell anemia who normally follows up at [facility]. The patient presents with his usual symptoms of sickle cell pain crisis which usually manifests itself by chest discomfort. The patient did feel some shortness of breath earlier today. He is currently not in any distress. He denies fevers, chills, nausea, vomiting, diarrhea, abdominal pain, dysuria, hematuria, rashes, headache. He does complain of neck pain.he stated he slipped on the ice and fell on his knees earlier this past week. On Saturday he began having some pain in his neck. He denies any radicular symptoms or weakness in his upper extremities. The patient denies any coughing, hemoptysis, hematochezia, melena. He denies dysuria, hematuria, genital ulcerations , penile drainage.  In the emergency department, the patient was given Dilaudid with minimal relief of this pain. As a result the patient was admitted for sickle cell crisis. Reticulocyte count was found to be 19.1%. Chest x-ray revealed right lower lobe infiltrate, but when compared to April 2013 this has not changed.   Assessment/Plan: Sickle cell pain crisis -Dilaudid 3mg  IV every 2 hours when necessary pain -IV fluids -Check hepatic enzymes, particularly bilirubin as the patient appears icteric--rule out hemolysis -Repeat reticulocyte on the morning -Check LDH Right lower lobe infiltrate -Question acute chest syndrome although clinically does not appear to have acute chest syndrome -Right lower lobe infiltrate has been present when compared to chest x-ray in April 2013 -CT chest without contrast -Discontinue antibiotics--patient has no fever and no leukocytosis Chest pain -V/Q scan to rule out PE -Cycle troponins Neck pain -Plain films of the  cervical spine showed loss of his lordotic curve without any acute fractures -CT cervical spine Trichomonas urethritis -Trichomonads were noted in the patient's urinalysis -The patient received 2 g metronidazole in the emergency department -Will check urine NAAT for GC/chlamydia -check HIV      Past Medical History  Diagnosis Date  . Sickle cell disease   . Acute chest syndrome(517.3)     ? in past   Past Surgical History  Procedure Laterality Date  . Appendectomy     Social History:  reports that he quit smoking about 3 years ago. His smoking use included Cigarettes. He smoked 0.00 packs per day. He has never used smokeless tobacco. He reports that  drinks alcohol. He reports that he does not use illicit drugs.   Family history reviewed, no pertinent family history  No Known Allergies    Prior to Admission medications   Medication Sig Start Date End Date Taking? Authorizing Provider  HYDROmorphone (DILAUDID) 2 MG tablet Take 2 mg by mouth every 4 (four) hours as needed for pain.   Yes Historical Provider, MD  oxyCODONE (OXYCONTIN) 15 MG TB12 Take 1 tablet (15 mg total) by mouth every 12 (twelve) hours. 10/05/11  Yes Vassie Loll, MD    Review of Systems:  Besides that discussed above, 10 point review of systems is negative  Physical Exam: Filed Vitals:   03/29/12 1517  BP: 146/58  Pulse: 88  Temp: 98.6 F (37 C)  TempSrc: Oral  Resp: 20  SpO2: 93%   General:  A&O x 3, NAD, nontoxic, pleasant/cooperative Head/Eye: No conjunctival hemorrhage, no icterus, [place]/AT, No nystagmus ENT:  No icterus,  No thrush, good dentition, no pharyngeal exudate Neck:  No masses, shotty anterior  cervical lymphadenpathy, no bruits CV:  RRR, no rub, no gallop, no S3 Lung:  Right basilar crackles. Left clear to auscultation Abdomen: soft/NT, +BS, nondistended, no peritoneal signs Ext: No cyanosis, No rashes, No petechiae, No lymphangitis, No edema   Labs on Admission:  Basic  Metabolic Panel:  Recent Labs Lab 03/29/12 1546  NA 138  K 3.4*  CL 103  CO2 24  GLUCOSE 110*  BUN 6  CREATININE 0.66  CALCIUM 8.7   Liver Function Tests: No results found for this basename: AST, ALT, ALKPHOS, BILITOT, PROT, ALBUMIN,  in the last 168 hours No results found for this basename: LIPASE, AMYLASE,  in the last 168 hours No results found for this basename: AMMONIA,  in the last 168 hours CBC:  Recent Labs Lab 03/29/12 1546  WBC 10.6*  HGB 9.2*  HCT 27.5*  MCV 72.4*  PLT 314   Cardiac Enzymes:  Recent Labs Lab 03/29/12 1546  TROPONINI <0.30   BNP: No components found with this basename: POCBNP,  CBG: No results found for this basename: GLUCAP,  in the last 168 hours  Radiological Exams on Admission: Dg Chest 2 View  03/29/2012  *RADIOLOGY REPORT*  Clinical Data: Sickle cell pain.  Left-sided neck pain.  No known injury.  CHEST - 2 VIEW  Comparison: 10/03/2011  Findings: Heart is mildly enlarged with prominent left ventricular contour.  There is patchy infiltrate involving the right lower lobe, similar in appearance to previous exam.  Findings are consistent with persistent or recurrent pneumonia in the right lower lobe.  There are no pleural effusions.  No evidence for pulmonary edema.  Surgical clips are identified in the right upper quadrant of the abdomen.  IMPRESSION:  1.  Right lower lobe infiltrate. 2.  Cardiomegaly without pulmonary edema.   Original Report Authenticated By: Norva Pavlov, M.D.    Dg Cervical Spine Complete  03/29/2012  *RADIOLOGY REPORT*  Clinical Data: Left-sided neck pain.  No known injury.  Sickle cell pain.  CERVICAL SPINE - COMPLETE 4+ VIEW  Comparison: None.  Findings: There is loss of cervical lordosis.  This may be secondary to splinting, soft tissue injury, or positioning. Minimal degenerative changes are identified, most notably at C5-6.  No evidence for acute fracture or subluxation.  Lung apices are clear.  IMPRESSION:  1.   Loss of lordosis.  See above. 2.  No evidence for acute fracture.   Original Report Authenticated By: Norva Pavlov, M.D.     EKG: Independently reviewed. Sinus rhythm, no ST to T wave changes    Time spent:70 minutes Code Status:   full Family Communication:   Family at bedside   TAT, DAVID, DO  [facility] Pager [phone]  If 7PM-7AM, please contact night-coverage [email or web address] Password [facility] 03/29/2012, 6:34 PM

## 2012-03-29 NOTE — ED Provider Notes (Signed)
  Medical screening examination/treatment/procedure(s) were performed by non-physician practitioner and as supervising physician I was immediately available for consultation/collaboration.    Robert Lockwood, MD 03/29/12 2352 

## 2012-03-29 NOTE — ED Provider Notes (Signed)
History     CSN: [id number]  Arrival date & time 03/29/12  1507   First MD Initiated Contact with Patient 03/29/12 1525      Chief Complaint  Patient presents with  . Chest Pain  . Sickle Cell Pain Crisis    (Consider location/radiation/quality/duration/timing/severity/associated sxs/prior treatment) HPI Comments: Pt states that he started with left neck pain times couple of days:pt states that the chest pain started yesterday:pt states that the pain is worse with breathing:pt states that he usually has be admitted when he has cp:pt states that he has not been admitted in 4 months for crisis  Patient is a 32 y.o. male presenting with sickle cell pain. The history is provided by the patient. No language interpreter was used.  Sickle Cell Pain Crisis  This is a recurrent problem. The current episode started yesterday. The problem occurs continuously. The problem has been unchanged. Pain location: left neck and chest. The pain is similar to prior episodes. The pain is severe. Nothing relieves the symptoms. Pertinent negatives include no headaches.    Past Medical History  Diagnosis Date  . Sickle cell disease   . Acute chest syndrome(517.3)     ? in past    Past Surgical History  Procedure Laterality Date  . Appendectomy      No family history on file.  History  Substance Use Topics  . Smoking status: Former Smoker    Types: Cigarettes    Quit date: 10/02/2008  . Smokeless tobacco: Never Used  . Alcohol Use: Yes     Comment: socially but none recently      Review of Systems  Constitutional: Negative for fever.  Respiratory: Negative.   Neurological: Negative for headaches.    Allergies  Review of patient's allergies indicates no known allergies.  Home Medications   Current Outpatient Rx  Name  Route  Sig  Dispense  Refill  . acetaminophen (TYLENOL) 325 MG tablet   Oral   Take 2 tablets (650 mg total) by mouth every 6 (six) hours as needed for fever (mild  pain).         . folic acid (FOLVITE) 1 MG tablet   Oral   Take 1 tablet (1 mg total) by mouth daily.   30 tablet   1   . guaiFENesin (MUCINEX) 600 MG 12 hr tablet   Oral   Take 1 tablet (600 mg total) by mouth 2 (two) times daily.   30 tablet   0   . oxyCODONE (OXYCONTIN) 15 MG TB12   Oral   Take 1 tablet (15 mg total) by mouth every 12 (twelve) hours.   45 tablet   0   . oxyCODONE (ROXICODONE) 30 MG immediate release tablet   Oral   Take 1 tablet (30 mg total) by mouth every 6 (six) hours as needed (breakthrough). PAIN   45 tablet   0     BP 146/58  Pulse 88  Temp(Src) 98.6 F (37 C) (Oral)  Resp 20  SpO2 93%  Physical Exam  Nursing note and vitals reviewed. Constitutional: He appears well-developed and well-nourished.  HENT:  Head: Normocephalic and atraumatic.  Eyes: Conjunctivae and EOM are normal. Scleral icterus is present.  Neck: Normal range of motion. Neck supple.  Cardiovascular: Normal rate and regular rhythm.   Pulmonary/Chest: Effort normal and breath sounds normal.  Musculoskeletal: Normal range of motion.  Left paraspinal cervical tenderness  Neurological: He is alert.  Skin: Skin is warm and dry.  Psychiatric: He has a normal mood and affect.    ED Course  Procedures (including critical care time)  Labs Reviewed  CBC - Abnormal; Notable for the following:    WBC 10.6 (*)    RBC 3.80 (*)    Hemoglobin 9.2 (*)    HCT 27.5 (*)    MCV 72.4 (*)    MCH 24.2 (*)    RDW 25.0 (*)    All other components within normal limits  BASIC METABOLIC PANEL - Abnormal; Notable for the following:    Potassium 3.4 (*)    Glucose, Bld 110 (*)    All other components within normal limits  RETICULOCYTES - Abnormal; Notable for the following:    Retic Ct Pct 15.1 (*)    RBC. 3.80 (*)    Retic Count, Manual 573.8 (*)    All other components within normal limits  URINALYSIS, ROUTINE W REFLEX MICROSCOPIC - Abnormal; Notable for the following:    Color,  Urine AMBER (*)    Bilirubin Urine SMALL (*)    Protein, ur 30 (*)    Urobilinogen, UA 4.0 (*)    Leukocytes, UA TRACE (*)    All other components within normal limits  URINE MICROSCOPIC-ADD ON - Abnormal; Notable for the following:    Bacteria, UA FEW (*)    All other components within normal limits  TROPONIN I   Dg Chest 2 View  03/29/2012  *RADIOLOGY REPORT*  Clinical Data: Sickle cell pain.  Left-sided neck pain.  No known injury.  CHEST - 2 VIEW  Comparison: 10/03/2011  Findings: Heart is mildly enlarged with prominent left ventricular contour.  There is patchy infiltrate involving the right lower lobe, similar in appearance to previous exam.  Findings are consistent with persistent or recurrent pneumonia in the right lower lobe.  There are no pleural effusions.  No evidence for pulmonary edema.  Surgical clips are identified in the right upper quadrant of the abdomen.  IMPRESSION:  1.  Right lower lobe infiltrate. 2.  Cardiomegaly without pulmonary edema.   Original Report Authenticated By: Norva Pavlov, M.D.    Dg Cervical Spine Complete  03/29/2012  *RADIOLOGY REPORT*  Clinical Data: Left-sided neck pain.  No known injury.  Sickle cell pain.  CERVICAL SPINE - COMPLETE 4+ VIEW  Comparison: None.  Findings: There is loss of cervical lordosis.  This may be secondary to splinting, soft tissue injury, or positioning. Minimal degenerative changes are identified, most notably at C5-6.  No evidence for acute fracture or subluxation.  Lung apices are clear.  IMPRESSION:  1.  Loss of lordosis.  See above. 2.  No evidence for acute fracture.   Original Report Authenticated By: Norva Pavlov, M.D.     Date: 03/29/2012  Rate: 88  Rhythm: normal sinus rhythm  QRS Axis: normal  Intervals: normal  ST/T Wave abnormalities: normal  Conduction Disutrbances:none  Narrative Interpretation:   Old EKG Reviewed: unchanged     1. Sickle cell crisis acute chest syndrome   2. Trichimoniasis        MDM  Pt to be admitted to the hospital for crisis with acute chest:Dr. Tat to admit        Teressa Lower, NP 03/29/12 1752  Teressa Lower, NP 03/29/12 1759  Teressa Lower, NP 03/29/12 1815

## 2012-03-29 NOTE — ED Notes (Signed)
Kerman Passey gave report to [place], [place]

## 2012-03-29 NOTE — ED Notes (Signed)
Pt presenting to ed with c/o left sided chest pain for about 48 hours now. Pt denies nausea and vomiting pt states positive shortness of breath. Pt states "I normally will get chest pains when I'm getting sick"

## 2012-03-30 ENCOUNTER — Inpatient Hospital Stay (HOSPITAL_COMMUNITY): Payer: Medicaid Other

## 2012-03-30 DIAGNOSIS — D5701 Hb-SS disease with acute chest syndrome: Secondary | ICD-10-CM

## 2012-03-30 DIAGNOSIS — J180 Bronchopneumonia, unspecified organism: Secondary | ICD-10-CM

## 2012-03-30 LAB — RETICULOCYTES: Retic Ct Pct: 15.7 % — ABNORMAL HIGH (ref 0.4–3.1)

## 2012-03-30 LAB — HIV ANTIBODY (ROUTINE TESTING W REFLEX): HIV: NONREACTIVE

## 2012-03-30 MED ORDER — POTASSIUM CHLORIDE CRYS ER 20 MEQ PO TBCR
20.0000 meq | EXTENDED_RELEASE_TABLET | Freq: Once | ORAL | Status: AC
  Administered 2012-03-30: 20 meq via ORAL
  Filled 2012-03-30: qty 1

## 2012-03-30 MED ORDER — DEXTROSE-NACL 5-0.45 % IV SOLN
INTRAVENOUS | Status: DC
  Administered 2012-03-30 (×3): via INTRAVENOUS

## 2012-03-30 MED ORDER — SODIUM CHLORIDE 0.9 % IV SOLN
INTRAVENOUS | Status: DC
  Administered 2012-03-30: 23:00:00 via INTRAVENOUS

## 2012-03-30 MED ORDER — TECHNETIUM TC 99M DIETHYLENETRIAME-PENTAACETIC ACID: 42.8000 | Freq: Once | INTRAVENOUS | Status: AC | PRN

## 2012-03-30 MED ORDER — ANTIPYRINE-BENZOCAINE 5.4-1.4 % OT SOLN
3.0000 [drp] | OTIC | Status: DC | PRN
  Administered 2012-03-30: 4 [drp] via OTIC
  Filled 2012-03-30 (×2): qty 10

## 2012-03-30 MED ORDER — TECHNETIUM TO 99M ALBUMIN AGGREGATED: 5.1000 | Freq: Once | INTRAVENOUS | Status: AC | PRN

## 2012-03-30 NOTE — Care Management Note (Signed)
    Page 1 of 1   03/30/2012     3:11:34 PM   CARE MANAGEMENT NOTE 03/30/2012  Patient:  Kerry Hill, Kerry Hill   Account Number:  [id number]  Date Initiated:  03/30/2012  Documentation initiated by:  Lanier Clam  Subjective/Objective Assessment:   ADMITTED W/SICKLE CELL PAIN.ZO:XWRUE CELL CRISIS.     Action/Plan:   FROM HOME.   Anticipated DC Date:  04/03/2012   Anticipated DC Plan:  HOME/SELF CARE      DC Planning Services  CM consult      Choice offered to / List presented to:             Status of service:  In process, will continue to follow Medicare Important Message given?   (If response is "NO", the following Medicare IM given date fields will be blank) Date Medicare IM given:   Date Additional Medicare IM given:    Discharge Disposition:    Per UR Regulation:  Reviewed for med. necessity/level of care/duration of stay  If discussed at Long Length of Stay Meetings, dates discussed:    Comments:  03/30/12 KATHY MAHABIR RN,BSN NCM [phone] NOTED ED CM HAS ALREADY EXPLORED THAT PATIENT CAN GET ALL MEDS THROUGH HIS MEDICAID COVERAGE.

## 2012-03-30 NOTE — Progress Notes (Signed)
Patient ID: Kerry Hill, male   DOB: 01/25/1980, 32 y.o.   MRN: [id number]  [facility] PROGRESS NOTE  Kerry Hill HYQ:[id number] DOB: Sep 29, 1980 DOA: 03/29/2012 PCP: Billee Cashing, MD  Brief narrative: Pt is 32 year old male with a history of sickle cell anemia who normally follows up at [facility]. The patient presents with his usual symptoms of sickle cell pain crisis which usually manifests itself by chest discomfort, associated with shortness of breath and subjective fevers, chills, intermittent episodes on productive cough of yellow sputum. Pt also endorsed sharp and intermittent neck pain, non radiating and 5/10 in severity when present.   In the emergency department, the patient was given Dilaudid with minimal relief of this pain. As a result the patient was admitted for sickle cell crisis. Reticulocyte count was found to be 19.1%. Chest x-ray revealed right lower lobe infiltrate, but when compared to April 2013 this has not changed. [facility] asked to admit for further evaluation and management.  Assessment/Plan:  Sickle cell pain crisis  - pt is clinically improving but reports pain is still present mostly in his chest but somewhat controlled on current analgesia  - will continue IVF, monitor daily CBC and BMP - LDH > 400 on admission, will recheck in AM Right lower lobe infiltrate  - Question acute chest syndrome although clinically does not appear to have acute chest syndrome  - Right lower lobe infiltrate has been present when compared to chest x-ray in April 2013  - CT chest without contrast with no evidence of airspace disease  - Discontinued antibiotics--patient has no fever, leukocytosis is mild and likely from demargination  Chest pain  - V/Q scan neagtive for PE, no events on telemetry over 24 hours  - troponins x 3 sets are within target range, no evidence of ischemia  Neck pain  - Plain films of the cervical spine showed loss of his lordotic curve  without any acute fractures  - CT cervical spine negative for acute events  Trichomonas urethritis  - Trichomonads were noted in the patient's urinalysis  - The patient received 2 g metronidazole in the emergency department  - HIV non reactive (3/11) Hypokalemia - mild, will supplement and repeat BMP in AM  Consultants:  None  Procedures/Studies: Dg Chest 2 View 03/29/2012 Right lower lobe infiltrate. Cardiomegaly without pulmonary edema.    Dg Cervical Spine Complete 03/29/2012  Loss of lordosis.  No evidence for acute fracture.   Ct Chest Wo Contrast 03/29/2012  The lungs are well expanded and clear.  No evidence of airspace disease.  Findings on recent chest radiograph may have been due to transient atelectasis. Cardiomegaly.  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    Ct Cervical Spine Wo Contrast 03/30/2012  No acute abnormality.  Slight degenerative disc disease at C4-5.     Nm Pulmonary Perf And Vent3/11/2012  Essentially normal ventilation and perfusion lung scans.  Very low probability of pulmonary embolus.     Antibiotics:  None   Code Status: Full Family Communication: Pt at bedside Disposition Plan: Home when medically stable  HPI/Subjective: No events overnight.   Objective: Filed Vitals:   03/30/12 0532 03/30/12 1424 03/30/12 2116 03/30/12 2140  BP: 131/57 132/59 114/73   Pulse: 86 77 100   Temp: 98.1 F (36.7 C) 97.8 F (36.6 C) 100.1 F (37.8 C) 99 F (37.2 C)  TempSrc: Oral Oral Axillary Axillary  Resp: 18 20 21  Height:      Weight:      SpO2: 95% 99% 97%     Intake/Output Summary (Last 24 hours) at 03/30/12 2147 Last data filed at 03/30/12 2143  Gross per 24 hour  Intake 3068.33 ml  Output    250 ml  Net 2818.33 ml    Exam:   General:  Pt is alert, follows commands appropriately, not in acute distress, icteric sclera   Cardiovascular: Regular rate and rhythm, S1/S2, no  murmurs, no rubs, no gallops  Respiratory: Clear to auscultation bilaterally, no wheezing, no crackles, no rhonchi  Abdomen: Soft, non tender, non distended, bowel sounds present, no guarding  Extremities: No edema, pulses DP and PT palpable bilaterally  Neuro: Grossly nonfocal  Data Reviewed: Basic Metabolic Panel:  Recent Labs Lab 03/29/12 1546  NA 138  K 3.4*  CL 103  CO2 24  GLUCOSE 110*  BUN 6  CREATININE 0.66  CALCIUM 8.7   Liver Function Tests:  Recent Labs Lab 03/29/12 1843  AST 38*  ALT 18  ALKPHOS 55  BILITOT 7.0*  PROT 7.4  ALBUMIN 3.7   CBC:  Recent Labs Lab 03/29/12 1546  WBC 10.6*  HGB 9.2*  HCT 27.5*  MCV 72.4*  PLT 314   Cardiac Enzymes:  Recent Labs Lab 03/29/12 1546 03/29/12 1843 03/30/12 0032  TROPONINI <0.30 <0.30 <0.30   Scheduled Meds: . enoxaparin (LOVENOX) injection  40 mg Subcutaneous Q24H  . OxyCODONE  15 mg Oral Q12H  . sodium chloride  3 mL Intravenous Q12H   Continuous Infusions: . dextrose 5 % and 0.45% NaCl 100 mL/hr at 03/30/12 2141     Debbora Presto, MD  [facility] Pager [phone]  If 7PM-7AM, please contact night-coverage [email or web address] Password [facility] 03/30/2012, 9:47 PM   LOS: 1 day

## 2012-03-30 NOTE — Progress Notes (Addendum)
Patient is complaining of an ear ache and requesting for someone to come look at it. NP, K. Schorr notified and will come assess later on tonight. NP saw redness in the ear, possibly from scratching or using q-tips. She ordered for patient to receive ear drops for the pain.

## 2012-03-30 NOTE — Progress Notes (Signed)
Patient is refusing to wear continuous pulse ox and oxygen. Patient's oxygen tends to drop when he is sleeping, down into the mid-high 80s.

## 2012-03-31 LAB — LACTATE DEHYDROGENASE: LDH: 332 U/L — ABNORMAL HIGH (ref 94–250)

## 2012-03-31 LAB — GC/CHLAMYDIA PROBE AMP: CT Probe RNA: NEGATIVE

## 2012-03-31 LAB — CBC
Hemoglobin: 8 g/dL — ABNORMAL LOW (ref 13.0–17.0)
Platelets: 251 10*3/uL (ref 150–400)
RBC: 3.29 MIL/uL — ABNORMAL LOW (ref 4.22–5.81)
WBC: 11.1 10*3/uL — ABNORMAL HIGH (ref 4.0–10.5)

## 2012-03-31 LAB — COMPREHENSIVE METABOLIC PANEL
AST: 35 U/L (ref 0–37)
BUN: 5 mg/dL — ABNORMAL LOW (ref 6–23)
CO2: 27 mEq/L (ref 19–32)
Calcium: 8.2 mg/dL — ABNORMAL LOW (ref 8.4–10.5)
Chloride: 104 mEq/L (ref 96–112)
Creatinine, Ser: 0.65 mg/dL (ref 0.50–1.35)
GFR calc Af Amer: >90 mL/min (ref >90)
GFR calc non Af Amer: >90 mL/min (ref >90)
Total Bilirubin: 4.3 mg/dL — ABNORMAL HIGH (ref 0.3–1.2)

## 2012-03-31 MED ORDER — OXYCODONE HCL 15 MG PO TABS: 15.0000 mg | ORAL_TABLET | ORAL | Status: DC | PRN

## 2012-03-31 MED ORDER — FOLIC ACID 1 MG PO TABS: 1.0000 mg | ORAL_TABLET | Freq: Every day | ORAL | Status: DC

## 2012-04-14 NOTE — Discharge Summary (Signed)
Physician Discharge Summary  Kerry Hill [id number]:[id number] DOB: [birth year] DOA: 03/29/2012  PCP: Billee Cashing, MD  Admit date: 03/29/2012 Discharge date: 03/31/2012  Time spent: 45 minutes  Recommendations for Outpatient Follow-up:  1. PCP in 1 week 2. Need to re-address : folic acid, hydroxyurea with patient  Discharge Diagnoses:  Active Problems:   Sickle cell pain crisis  chest pain  H/o acute chest syndrome    Discharge Condition: stab;e  Diet recommendation: regular  Filed Weights   03/29/12 2007  Weight: 107.4 kg (236 lb 12.4 oz)    History of present illness:  32 year old male with a history of sickle cell anemia who normally follows up at [facility]. The patient presents with his usual symptoms of sickle cell pain crisis which usually manifests itself by chest discomfort. The patient did feel some shortness of breath earlier today. He is currently not in any distress. He denies fevers, chills, nausea, vomiting, diarrhea, abdominal pain, dysuria, hematuria, rashes, headache. He does complain of neck pain.he stated he slipped on the ice and fell on his knees earlier this past week. On Saturday he began having some pain in his neck. He denies any radicular symptoms or weakness in his upper extremities. The patient denies any coughing, hemoptysis, hematochezia, melena. He denies dysuria, hematuria, genital ulcerations , penile drainage.  In the emergency department, the patient was given Dilaudid with minimal relief of this pain. As a result the patient was admitted for sickle cell crisis. Reticulocyte count was found to be 19.1%. Chest x-ray revealed right lower lobe infiltrate, but when compared to April 2013 this has not changed.   Hospital Course:  Sickle cell pain crisis  - pt clinically improved with supportive care using IVF, narcotics, oxygen - LDH was > 400 on admission - advised to FU with his PCP and [facility] to discuss hydroxyurea  and folic acid and its importance    Right lower lobe infiltrate  - Initially there was a Question of acute chest syndrome although clinically did not appear to have acute chest syndrome  - Right lower lobe infiltrate has been present when compared to chest x-ray in April 2013  - CT chest without contrast with no evidence of airspace disease  - Discontinued antibiotics--patient has no fever, leukocytosis was mild and likely from demargination   Chest pain  - V/Q scan neagtive for PE, no events on telemetry over 24 hours  - troponins x 3 sets are within target range, no evidence of ischemia   Neck pain  - Plain films of the cervical spine showed loss of his lordotic curve without any acute fractures  - CT cervical spine negative for acute events   Trichomonas urethritis  - Trichomonads were noted in the patient's urinalysis  - The patient received 2 g metronidazole in the emergency department  - HIV non reactive (3/11)   Discharge Exam: Filed Vitals:   03/30/12 2116 03/30/12 2140 03/31/12 0510 03/31/12 1320  BP: 114/73  126/65 130/65  Pulse: 100  71 76  Temp: 100.1 F (37.8 C) 99 F (37.2 C) 98.6 F (37 C) 98.3 F (36.8 C)  TempSrc: Axillary Axillary Oral Oral  Resp: 21  19 20   Height:      Weight:      SpO2: 97%  92% 92%    General: AAOx3 Cardiovascular: S1S2/RRR Respiratory: CTAB  Discharge Instructions  Discharge Orders   Future Orders Complete By Expires     Discharge instructions  As  directed     Comments:      DO not drive/operate machinery while taking narcotics        Medication List    STOP taking these medications       HYDROmorphone 2 MG tablet  Commonly known as:  DILAUDID     oxyCODONE 15 MG Tb12  Commonly known as:  OXYCONTIN      TAKE these medications       folic acid 1 MG tablet  Commonly known as:  FOLVITE  Take 1 tablet (1 mg total) by mouth daily.     oxyCODONE 15 MG immediate release tablet  Commonly known as:  ROXICODONE   Take 1 tablet (15 mg total) by mouth every 4 (four) hours as needed for pain.           Follow-up Information   Follow up with Sharen Counter, MD In 1 week. ([facility])        The results of significant diagnostics from this hospitalization (including imaging, microbiology, ancillary and laboratory) are listed below for reference.    Significant Diagnostic Studies: Dg Chest 2 View  03/29/2012  *RADIOLOGY REPORT*  Clinical Data: Sickle cell pain.  Left-sided neck pain.  No known injury.  CHEST - 2 VIEW  Comparison: 10/03/2011  Findings: Heart is mildly enlarged with prominent left ventricular contour.  There is patchy infiltrate involving the right lower lobe, similar in appearance to previous exam.  Findings are consistent with persistent or recurrent pneumonia in the right lower lobe.  There are no pleural effusions.  No evidence for pulmonary edema.  Surgical clips are identified in the right upper quadrant of the abdomen.  IMPRESSION:  1.  Right lower lobe infiltrate. 2.  Cardiomegaly without pulmonary edema.   Original Report Authenticated By: Norva Pavlov, M.D.    Dg Cervical Spine Complete  03/29/2012  *RADIOLOGY REPORT*  Clinical Data: Left-sided neck pain.  No known injury.  Sickle cell pain.  CERVICAL SPINE - COMPLETE 4+ VIEW  Comparison: None.  Findings: There is loss of cervical lordosis.  This may be secondary to splinting, soft tissue injury, or positioning. Minimal degenerative changes are identified, most notably at C5-6.  No evidence for acute fracture or subluxation.  Lung apices are clear.  IMPRESSION:  1.  Loss of lordosis.  See above. 2.  No evidence for acute fracture.   Original Report Authenticated By: Norva Pavlov, M.D.    Ct Chest Wo Contrast  03/29/2012  *RADIOLOGY REPORT*  Clinical Data: Chest pain and chronic right lower lobe infiltrate. History of sickle cell disease.  CT CHEST WITHOUT CONTRAST  Technique:  Multidetector CT imaging of the chest  was performed following the standard protocol without IV contrast.  Comparison: Chest radiograph 03/29/2012  Findings: The heart is enlarged.  Thoracic aorta is normal in caliber.  Negative for pleural or pericardial effusion.  Negative for lymphadenopathy.  The trachea and mainstem bronchi are patent. The patient's lungs are well expanded and clear.  Negative for pulmonary edema or focal airspace disease.  No significant atelectasis.  Findings on today's earlier chest radiograph may have reflected transient atelectasis.  There is height loss of the endplates of the central aspects of some of the vertebral bodies, consistent with the history of sickle cell disease.  No suspicious bony abnormality.  Cholecystectomy clips are present.  1.9 cm low density lesion in the upper pole of the right kidney laterally is partially imaged.  This measures water density and is  likely a cyst, but incompletely characterized. 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  IMPRESSION:  1.  The lungs are well expanded and clear.  No evidence of airspace disease.  Findings on recent chest radiograph may have been due to transient atelectasis. 2.  Cardiomegaly. 3.  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   Original Report Authenticated By: Britta Mccreedy, M.D.    Ct Cervical Spine Wo Contrast  03/30/2012  *RADIOLOGY REPORT*  Clinical Data: Neck pain after fall.  CT CERVICAL SPINE WITHOUT CONTRAST  Technique:  Multidetector CT imaging of the cervical spine was performed. Multiplanar CT image reconstructions were also generated.  Comparison: Radiographs dated 03/29/2012  Findings: There is no fracture, subluxation, disc space narrowing, prevertebral soft tissue swelling, or other significant abnormality.  Small asymmetric disc bulge into the left lateral recess and left neural foramen at C4-5  without focal neural impingement.  IMPRESSION:  No acute abnormality.  Slight degenerative disc disease at C4-5.   Original Report Authenticated By: Francene Boyers, M.D.    Nm Pulmonary Perf And Vent  03/30/2012  *RADIOLOGY REPORT*  Clinical Data: Chest pain and shortness of breath; sickle cell disease  NM PULMONARY VENTILATION AND PERFUSION SCAN  Views:  Anterior, posterior, right lateral, left lateral, RPO, LPO, RAO, LAO - ventilation and perfusion  Radiopharmaceutical:  Technetium 48m DTPA - ventilation; technetium 62m macroaggregated albumin - perfusion  Dose:  42.8 mCi - ventilation; 5.1 mCi - perfusion  Route of administration:  Inhalation - ventilation; intravenous - perfusion  Comparison: Chest radiograph March 29, 2012  Findings:  The ventilation study shows homogeneous and symmetric uptake of radiotracer bilaterally.  The perfusion study shows homogeneous and symmetric uptake of radiotracer bilaterally.  There is no appreciable ventilation / perfusion mismatch.  IMPRESSION: Essentially normal ventilation and perfusion lung scans.  Very low probability of pulmonary embolus.   Original Report Authenticated By: Bretta Bang, M.D.     Microbiology: No results found for this or any previous visit (from the past 240 hour(s)).   Labs: Basic Metabolic Panel: No results found for this basename: NA, K, CL, CO2, GLUCOSE, BUN, CREATININE, CALCIUM, MG, PHOS,  in the last 168 hours Liver Function Tests: No results found for this basename: AST, ALT, ALKPHOS, BILITOT, PROT, ALBUMIN,  in the last 168 hours No results found for this basename: LIPASE, AMYLASE,  in the last 168 hours No results found for this basename: AMMONIA,  in the last 168 hours CBC: No results found for this basename: WBC, NEUTROABS, HGB, HCT, MCV, PLT,  in the last 168 hours Cardiac Enzymes: No results found for this basename: CKTOTAL, CKMB, CKMBINDEX, TROPONINI,  in the last 168 hours BNP: BNP (last 3 results) No results found  for this basename: PROBNP,  in the last 8760 hours CBG: No results found for this basename: GLUCAP,  in the last 168 hours     Signed:  Elize Pinon  [facility] 04/14/2012, 5:11 PM

## 2012-08-17 ENCOUNTER — Emergency Department (HOSPITAL_COMMUNITY)
Admission: EM | Admit: 2012-08-17 | Discharge: 2012-08-18 | Disposition: A | Discharge status: Home / Self Care | Payer: Medicaid Other | Attending: Emergency Medicine | Admitting: Emergency Medicine

## 2012-08-17 DIAGNOSIS — Z87891 Personal history of nicotine dependence: Secondary | ICD-10-CM | POA: Insufficient documentation

## 2012-08-17 DIAGNOSIS — S39012A Strain of muscle, fascia and tendon of lower back, initial encounter: Secondary | ICD-10-CM

## 2012-08-17 DIAGNOSIS — Y9389 Activity, other specified: Secondary | ICD-10-CM | POA: Insufficient documentation

## 2012-08-17 DIAGNOSIS — X503XXA Overexertion from repetitive movements, initial encounter: Secondary | ICD-10-CM | POA: Insufficient documentation

## 2012-08-17 DIAGNOSIS — Y929 Unspecified place or not applicable: Secondary | ICD-10-CM | POA: Insufficient documentation

## 2012-08-17 DIAGNOSIS — D571 Sickle-cell disease without crisis: Secondary | ICD-10-CM | POA: Insufficient documentation

## 2012-08-17 DIAGNOSIS — S335XXA Sprain of ligaments of lumbar spine, initial encounter: Secondary | ICD-10-CM | POA: Insufficient documentation

## 2012-08-17 DIAGNOSIS — Z9889 Other specified postprocedural states: Secondary | ICD-10-CM | POA: Insufficient documentation

## 2012-08-17 DIAGNOSIS — Z8709 Personal history of other diseases of the respiratory system: Secondary | ICD-10-CM | POA: Insufficient documentation

## 2012-08-17 DIAGNOSIS — Z79899 Other long term (current) drug therapy: Secondary | ICD-10-CM | POA: Insufficient documentation

## 2012-08-18 ENCOUNTER — Encounter (HOSPITAL_COMMUNITY): Payer: Self-pay

## 2012-08-18 MED ORDER — METHOCARBAMOL 500 MG PO TABS: 500.0000 mg | ORAL_TABLET | Freq: Two times a day (BID) | ORAL | Status: DC

## 2012-08-18 NOTE — ED Provider Notes (Signed)
CSN: [id number]     Arrival date & time 08/17/12  2347 History     First MD Initiated Contact with Patient 08/18/12 0015     Chief Complaint  Patient presents with  . Back Pain   (Consider location/radiation/quality/duration/timing/severity/associated sxs/prior Treatment) HPI  Kerry Hill is a 32 y.o. male with past medical history significant for sickle cell complaining of low back pain after patient had an unusual exertion while moving yesterday. Patient rates his pain at 8/10, it is described as tight aching, it is exacerbated by movement and bending over. He is been taking 15 mg oxycodone with little relief. He was lifting heavy objects all day long. Patient denies numbness, weakness, paresthesia, difficulty ambulating, radiation of pain, change in bowel or bladder habits, fever, history of IV drug use or cancer.  Past Medical History  Diagnosis Date  . Sickle cell disease   . Acute chest syndrome(517.3)     ? in past   Past Surgical History  Procedure Laterality Date  . Appendectomy     History reviewed. No pertinent family history. History  Substance Use Topics  . Smoking status: Former Smoker    Types: Cigarettes    Quit date: 10/02/2008  . Smokeless tobacco: Never Used  . Alcohol Use: Yes     Comment: socially but none recently    Review of Systems 10 systems reviewed and found to be negative, except as noted in the HPI   Allergies  Review of patient's allergies indicates no known allergies.  Home Medications   Current Outpatient Rx  Name  Route  Sig  Dispense  Refill  . folic acid (FOLVITE) 1 MG tablet   Oral   Take 1 tablet (1 mg total) by mouth daily.   30 tablet   0   . methocarbamol (ROBAXIN) 500 MG tablet   Oral   Take 1 tablet (500 mg total) by mouth 2 (two) times daily. Take 1-2 tablets by mouth 3 times a day when necessary pain   20 tablet   0   . oxyCODONE (ROXICODONE) 15 MG immediate release tablet   Oral   Take 1 tablet (15 mg total) by  mouth every 4 (four) hours as needed for pain.   1 tablet   0    BP 138/73  Pulse 86  Temp(Src) 98.5 F (36.9 C) (Oral)  Resp 20  Ht 6' (1.829 m)  Wt 235 lb (106.595 kg)  BMI 31.86 kg/m2  SpO2 95% Physical Exam  Nursing note and vitals reviewed. Constitutional: He is oriented to person, place, and time. He appears well-developed and well-nourished. No distress.  HENT:  Head: Normocephalic.  Eyes: Conjunctivae and EOM are normal.  Cardiovascular: Normal rate.   Pulmonary/Chest: Effort normal. No stridor.  Musculoskeletal: Normal range of motion.  Right lumbar paraspinal musculature spasm with tenderness to palpation  Neurological: He is alert and oriented to person, place, and time.  Strength is 5 out of 5 to the lower extremities, extensor hallux longus 5 out of 5 bilaterally, distal sensation is grossly intact.  Psychiatric: He has a normal mood and affect.    ED Course   Procedures (including critical care time)  Labs Reviewed - No data to display No results found. 1. Lumbar strain, initial encounter     MDM   Filed Vitals:   08/17/12 2350  BP: 138/73  Pulse: 86  Temp: 98.5 F (36.9 C)  TempSrc: Oral  Resp: 20  Height: 6' (1.829 m)  Weight:  235 lb (106.595 kg)  SpO2: 95%     Kerry Hill is a 32 y.o. male with right-sided lumbosacral strain after he was moving yesterday. Physical exam shows normal strength and sensation. Pain control options limited because patient is driving home. No red flags  Pt is hemodynamically stable, appropriate for, and amenable to discharge at this time. Pt verbalized understanding and agrees with care plan. All questions answered. Outpatient follow-up and specific return precautions discussed.    New Prescriptions   METHOCARBAMOL (ROBAXIN) 500 MG TABLET    Take 1 tablet (500 mg total) by mouth 2 (two) times daily. Take 1-2 tablets by mouth 3 times a day when necessary pain    Note: Portions of this report may have been  transcribed using voice recognition software. Every effort was made to ensure accuracy; however, inadvertent computerized transcription errors may be present    Wynetta Emery, PA-C 08/18/12 [phone]

## 2012-08-18 NOTE — ED Notes (Signed)
Pt report increased back pain that started yesterday. Pain increases when getting up and walking. Pt reports that his legs feel like jello and that there is a clinching in his back.

## 2012-08-18 NOTE — ED Provider Notes (Signed)
Medical screening examination/treatment/procedure(s) were performed by non-physician practitioner and as supervising physician I was immediately available for consultation/collaboration.  Tatisha Cerino, MD 08/18/12 0443 

## 2012-09-10 ENCOUNTER — Encounter (HOSPITAL_COMMUNITY): Payer: Self-pay | Admitting: *Deleted

## 2012-09-10 ENCOUNTER — Emergency Department (HOSPITAL_COMMUNITY)
Admission: EM | Admit: 2012-09-10 | Discharge: 2012-09-11 | Disposition: A | Discharge status: Home / Self Care | Payer: Medicaid Other | Attending: Emergency Medicine | Admitting: Emergency Medicine

## 2012-09-10 ENCOUNTER — Emergency Department (HOSPITAL_COMMUNITY): Payer: Medicaid Other

## 2012-09-10 DIAGNOSIS — M25569 Pain in unspecified knee: Secondary | ICD-10-CM | POA: Insufficient documentation

## 2012-09-10 DIAGNOSIS — D5700 Hb-SS disease with crisis, unspecified: Secondary | ICD-10-CM | POA: Insufficient documentation

## 2012-09-10 DIAGNOSIS — Z8709 Personal history of other diseases of the respiratory system: Secondary | ICD-10-CM | POA: Insufficient documentation

## 2012-09-10 DIAGNOSIS — Z87891 Personal history of nicotine dependence: Secondary | ICD-10-CM | POA: Insufficient documentation

## 2012-09-10 DIAGNOSIS — R296 Repeated falls: Secondary | ICD-10-CM | POA: Insufficient documentation

## 2012-09-10 DIAGNOSIS — D571 Sickle-cell disease without crisis: Secondary | ICD-10-CM

## 2012-09-10 DIAGNOSIS — IMO0002 Reserved for concepts with insufficient information to code with codable children: Secondary | ICD-10-CM | POA: Insufficient documentation

## 2012-09-10 DIAGNOSIS — R300 Dysuria: Secondary | ICD-10-CM | POA: Insufficient documentation

## 2012-09-10 DIAGNOSIS — M25561 Pain in right knee: Secondary | ICD-10-CM

## 2012-09-10 DIAGNOSIS — Y929 Unspecified place or not applicable: Secondary | ICD-10-CM | POA: Insufficient documentation

## 2012-09-10 DIAGNOSIS — Y9302 Activity, running: Secondary | ICD-10-CM | POA: Insufficient documentation

## 2012-09-10 DIAGNOSIS — T148XXA Other injury of unspecified body region, initial encounter: Secondary | ICD-10-CM

## 2012-09-10 LAB — POCT I-STAT, CHEM 8
BUN: <3 mg/dL — ABNORMAL LOW (ref 6–23)
Chloride: 105 mEq/L (ref 96–112)
HCT: 28 % — ABNORMAL LOW (ref 39.0–52.0)
Sodium: 142 mEq/L (ref 135–145)
TCO2: 24 mmol/L (ref 0–100)

## 2012-09-10 MED ORDER — CEFTRIAXONE SODIUM 250 MG IJ SOLR
250.0000 mg | Freq: Once | INTRAMUSCULAR | Status: AC
  Administered 2012-09-11: 250 mg via INTRAMUSCULAR
  Filled 2012-09-10: qty 250

## 2012-09-10 MED ORDER — AZITHROMYCIN 250 MG PO TABS
1000.0000 mg | ORAL_TABLET | Freq: Once | ORAL | Status: AC
  Administered 2012-09-11: 1000 mg via ORAL
  Filled 2012-09-10: qty 4

## 2012-09-10 MED ORDER — SODIUM CHLORIDE 0.9 % IV BOLUS (SEPSIS)
500.0000 mL | Freq: Once | INTRAVENOUS | Status: AC
  Administered 2012-09-11: 500 mL via INTRAVENOUS

## 2012-09-10 MED ORDER — KETOROLAC TROMETHAMINE 30 MG/ML IJ SOLN
30.0000 mg | Freq: Once | INTRAMUSCULAR | Status: AC
  Administered 2012-09-11: 30 mg via INTRAVENOUS
  Filled 2012-09-10: qty 1

## 2012-09-10 MED ORDER — HYDROMORPHONE HCL PF 1 MG/ML IJ SOLN
1.0000 mg | Freq: Once | INTRAMUSCULAR | Status: AC
  Administered 2012-09-11: 1 mg via INTRAVENOUS
  Filled 2012-09-10: qty 1
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
## 2012-09-11 LAB — CBC WITH DIFFERENTIAL/PLATELET
Basophils Relative: 1 % (ref 0–1)
Eosinophils Absolute: 0.1 10*3/uL (ref 0.0–0.7)
Eosinophils Relative: 1 % (ref 0–5)
Hemoglobin: 8.4 g/dL — ABNORMAL LOW (ref 13.0–17.0)
Lymphocytes Relative: 29 % (ref 12–46)
Neutrophils Relative %: 55 % (ref 43–77)
RBC: 3.42 MIL/uL — ABNORMAL LOW (ref 4.22–5.81)

## 2012-09-13 ENCOUNTER — Emergency Department (HOSPITAL_COMMUNITY): Payer: Medicaid Other

## 2012-09-13 ENCOUNTER — Emergency Department (HOSPITAL_COMMUNITY)
Admission: EM | Admit: 2012-09-13 | Discharge: 2012-09-13 | Disposition: A | Discharge status: Home / Self Care | Payer: Medicaid Other | Attending: Emergency Medicine | Admitting: Emergency Medicine

## 2012-09-13 ENCOUNTER — Encounter (HOSPITAL_COMMUNITY): Payer: Self-pay | Admitting: Emergency Medicine

## 2012-09-13 DIAGNOSIS — D571 Sickle-cell disease without crisis: Secondary | ICD-10-CM | POA: Insufficient documentation

## 2012-09-13 DIAGNOSIS — R42 Dizziness and giddiness: Secondary | ICD-10-CM | POA: Insufficient documentation

## 2012-09-13 DIAGNOSIS — Z87891 Personal history of nicotine dependence: Secondary | ICD-10-CM | POA: Insufficient documentation

## 2012-09-13 LAB — COMPREHENSIVE METABOLIC PANEL
ALT: 49 U/L (ref 0–53)
AST: 72 U/L — ABNORMAL HIGH (ref 0–37)
Alkaline Phosphatase: 60 U/L (ref 39–117)
CO2: 24 mEq/L (ref 19–32)
Chloride: 104 mEq/L (ref 96–112)
GFR calc non Af Amer: >90 mL/min (ref >90)
Potassium: 3.8 mEq/L (ref 3.5–5.1)
Sodium: 138 mEq/L (ref 135–145)
Total Bilirubin: 7.4 mg/dL — ABNORMAL HIGH (ref 0.3–1.2)

## 2012-09-13 LAB — CBC WITH DIFFERENTIAL/PLATELET
Eosinophils Relative: 2 % (ref 0–5)
Lymphocytes Relative: 29 % (ref 12–46)
Lymphs Abs: 2.9 10*3/uL (ref 0.7–4.0)
MCV: 72.6 fL — ABNORMAL LOW (ref 78.0–100.0)
Monocytes Relative: 16 % — ABNORMAL HIGH (ref 3–12)
Neutrophils Relative %: 52 % (ref 43–77)
Platelets: 298 10*3/uL (ref 150–400)
RBC: 3.65 MIL/uL — ABNORMAL LOW (ref 4.22–5.81)
WBC: 10 10*3/uL (ref 4.0–10.5)

## 2012-09-13 LAB — URINALYSIS, ROUTINE W REFLEX MICROSCOPIC
Bilirubin Urine: NEGATIVE
Glucose, UA: NEGATIVE mg/dL
Hgb urine dipstick: NEGATIVE
Protein, ur: 30 mg/dL — AB
Urobilinogen, UA: 0.2 mg/dL (ref 0.0–1.0)

## 2012-09-13 MED ORDER — DIAZEPAM 5 MG PO TABS
5.0000 mg | ORAL_TABLET | Freq: Once | ORAL | Status: AC
  Administered 2012-09-13: 5 mg via ORAL
  Filled 2012-09-13: qty 1

## 2012-09-13 MED ORDER — DIAZEPAM 5 MG PO TABS: 5.0000 mg | ORAL_TABLET | Freq: Three times a day (TID) | ORAL | Status: DC | PRN

## 2012-09-13 MED ORDER — MECLIZINE HCL 25 MG PO TABS: 25.0000 mg | ORAL_TABLET | Freq: Three times a day (TID) | ORAL | Status: DC | PRN

## 2012-09-13 MED ORDER — MECLIZINE HCL 25 MG PO TABS
25.0000 mg | ORAL_TABLET | Freq: Once | ORAL | Status: AC
  Administered 2012-09-13: 25 mg via ORAL
  Filled 2012-09-13: qty 1

## 2012-09-13 NOTE — ED Notes (Signed)
Pt currently talking on cell phone.  
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
## 2012-09-13 NOTE — ED Provider Notes (Signed)
CSN: [id number]     Arrival date & time 09/13/12  1315 History     First MD Initiated Contact with Patient 09/13/12 1456     Chief Complaint  Patient presents with  . Dizziness   (Consider location/radiation/quality/duration/timing/severity/associated sxs/prior Treatment) HPI Comments: Dizziness described as spinning.  Patient is a 32 y.o. male presenting with neurologic complaint. The history is provided by the patient.  Neurologic Problem This is a new problem. The current episode started more than 2 days ago. The problem occurs constantly. Progression since onset: waxing and waning. Pertinent negatives include no chest pain, no abdominal pain and no shortness of breath. Exacerbated by: moving around, changing positions. Nothing relieves the symptoms. He has tried nothing for the symptoms. The treatment provided no relief.    Past Medical History  Diagnosis Date  . Sickle cell disease   . Acute chest syndrome(517.3)     ? in past   Past Surgical History  Procedure Laterality Date  . Appendectomy     No family history on file. History  Substance Use Topics  . Smoking status: Former Smoker    Types: Cigarettes    Quit date: 10/02/2008  . Smokeless tobacco: Never Used  . Alcohol Use: Yes     Comment: socially but none recently    Review of Systems  Constitutional: Negative for fever.  Respiratory: Negative for cough and shortness of breath.   Cardiovascular: Negative for chest pain.  Gastrointestinal: Negative for vomiting and abdominal pain.  All other systems reviewed and are negative.    Allergies  Review of patient's allergies indicates no known allergies.  Home Medications   Current Outpatient Rx  Name  Route  Sig  Dispense  Refill  . oxyCODONE (ROXICODONE) 15 MG immediate release tablet   Oral   Take 1 tablet (15 mg total) by mouth every 4 (four) hours as needed for pain.   1 tablet   0    BP 145/68  Pulse 78  Temp(Src) 97.8 F (36.6 C) (Oral)   Resp 18  SpO2 100% Physical Exam  Nursing note and vitals reviewed. Constitutional: He is oriented to person, place, and time. He appears well-developed and well-nourished. No distress.  HENT:  Head: Normocephalic and atraumatic.  Mouth/Throat: No oropharyngeal exudate.  Eyes: EOM are normal. Pupils are equal, round, and reactive to light.  Neck: Normal range of motion. Neck supple.  Cardiovascular: Normal rate and regular rhythm.  Exam reveals no friction rub.   No murmur heard. Pulmonary/Chest: Effort normal and breath sounds normal. No respiratory distress. He has no wheezes. He has no rales.  Abdominal: He exhibits no distension. There is no tenderness. There is no rebound.  Musculoskeletal: Normal range of motion. He exhibits no edema.  Neurological: He is alert and oriented to person, place, and time.  Skin: He is not diaphoretic.    ED Course   Procedures (including critical care time)  Labs Reviewed  CBC WITH DIFFERENTIAL - Abnormal; Notable for the following:    RBC 3.65 (*)    Hemoglobin 9.2 (*)    HCT 26.5 (*)    MCV 72.6 (*)    MCH 25.2 (*)    RDW 26.3 (*)    Monocytes Relative 16 (*)    Monocytes Absolute 1.6 (*)    All other components within normal limits  COMPREHENSIVE METABOLIC PANEL - Abnormal; Notable for the following:    BUN 5 (*)    AST 72 (*)    Total  Bilirubin 7.4 (*)    All other components within normal limits  URINALYSIS, ROUTINE W REFLEX MICROSCOPIC - Abnormal; Notable for the following:    Color, Urine AMBER (*)    Protein, ur 30 (*)    Leukocytes, UA TRACE (*)    All other components within normal limits  URINE MICROSCOPIC-ADD ON   Mr Brain Wo Contrast  09/13/2012   *RADIOLOGY REPORT*  Clinical Data: Vertigo ataxia dizziness.  Sickle cell disease  MRI HEAD WITHOUT CONTRAST  Technique:  Multiplanar, multiecho pulse sequences of the brain and surrounding structures were obtained according to standard protocol without intravenous contrast.   Comparison: None.  Findings: Negative for acute infarct.  Patchy hyperintensity in the right frontal white matter consistent with chronic infarction. Small lesion in the left frontal white matter most likely chronic infarct as well.  Brainstem and cerebellum are normal.  Negative for intracranial hemorrhage.  3 mm cyst in the left atrium of the lateral ventricle.  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  This is most likely incidental.  Cerebellar tonsils extend 9 mm below the foramen magnum show mild impaction.  No syrinx identified in the cervical cord.  Mild retention cyst in the right maxillary sinus.  IMPRESSION: Chronic right frontal white matter infarct.  No acute infarct.  Chiari malformation.   Original Report Authenticated By: Janeece Riggers, M.D.   1. Dizziness   2. Vertigo     MDM  A 32 year old male with history of sickle cell disease presents with dizziness. Persistent dizziness since a fall few days ago. When he fell he caught himself withwith his hands. He did not his head. He states the dizziness is spinning in sensation. It is best when lying on his right side and worse when moving from that position. Has been constant, however waxes and wanes. He denies any neurologic symptoms. Patient's exam is normal. Patient symptoms consistent with vertigo. Will give meclizine and Valium. On re-exam, patient not improved after meds. Ataxic. Will MR Brain to see if patient had a stroke. MR normal. Clinical picture c/w vertigo. Patient given meclizine and valium, discharged home in stable condition.  I have reviewed all labs and imaging and considered them in my medical decision making.   Dagmar Hait, MD 09/13/12 [phone]

## 2012-09-13 NOTE — ED Notes (Signed)
Patient transported to MRI 

## 2012-09-13 NOTE — ED Notes (Signed)
For the past few days now pt has had dizzyness when he lays down, states that he was seen a few nights ago for the samething but it has not getting any better. Alert x4, no slurred speech, pt is talking on cell phone while in triage.

## 2012-10-01 ENCOUNTER — Telehealth: Payer: Self-pay

## 2012-10-01 NOTE — Telephone Encounter (Signed)
This CM spoke with Mr. Hence to schedule appointment to establish care with Dr. Ashley Royalty. First available appointment is on 10/19/12@ 3:45pm. [name] asked if he can be seen sooner this CM advised I would check and call him back.   This CM left message and awaiting call back.   Karoline Caldwell, RN, BSN, [place]    [phone]

## 2012-10-05 ENCOUNTER — Encounter: Payer: Self-pay | Admitting: Internal Medicine

## 2012-10-05 ENCOUNTER — Ambulatory Visit (INDEPENDENT_AMBULATORY_CARE_PROVIDER_SITE_OTHER): Payer: Medicaid Other | Admitting: Internal Medicine

## 2012-10-05 ENCOUNTER — Ambulatory Visit (HOSPITAL_COMMUNITY)
Admission: AD | Admit: 2012-10-05 | Discharge: 2012-10-05 | Disposition: A | Discharge status: Home / Self Care | Payer: Medicaid Other | Source: Ambulatory Visit | Attending: Internal Medicine | Admitting: Internal Medicine

## 2012-10-05 VITALS — BP 150/74 | HR 91 | Temp 99.0°F | Resp 16 | Ht 70.0 in | Wt 230.0 lb

## 2012-10-05 DIAGNOSIS — I6350 Cerebral infarction due to unspecified occlusion or stenosis of unspecified cerebral artery: Secondary | ICD-10-CM

## 2012-10-05 DIAGNOSIS — D571 Sickle-cell disease without crisis: Secondary | ICD-10-CM | POA: Insufficient documentation

## 2012-10-05 DIAGNOSIS — M4120 Other idiopathic scoliosis, site unspecified: Secondary | ICD-10-CM

## 2012-10-05 DIAGNOSIS — I639 Cerebral infarction, unspecified: Secondary | ICD-10-CM | POA: Insufficient documentation

## 2012-10-05 LAB — CBC WITH DIFFERENTIAL/PLATELET
Basophils Absolute: 0.1 10*3/uL (ref 0.0–0.1)
Eosinophils Relative: 1 % (ref 0–5)
HCT: 27 % — ABNORMAL LOW (ref 39.0–52.0)
Lymphocytes Relative: 35 % (ref 12–46)
Lymphs Abs: 4.1 10*3/uL — ABNORMAL HIGH (ref 0.7–4.0)
MCV: 73.2 fL — ABNORMAL LOW (ref 78.0–100.0)
Monocytes Relative: 16 % — ABNORMAL HIGH (ref 3–12)
Platelets: 272 10*3/uL (ref 150–400)
RBC: 3.69 MIL/uL — ABNORMAL LOW (ref 4.22–5.81)
RDW: 22.9 % — ABNORMAL HIGH (ref 11.5–15.5)
WBC: 11.7 10*3/uL — ABNORMAL HIGH (ref 4.0–10.5)

## 2012-10-05 LAB — C-REACTIVE PROTEIN: CRP: <0.5 mg/dL — ABNORMAL LOW (ref <0.60)

## 2012-10-05 LAB — COMPREHENSIVE METABOLIC PANEL
CO2: 23 mEq/L (ref 19–32)
Calcium: 9.3 mg/dL (ref 8.4–10.5)
Chloride: 104 mEq/L (ref 96–112)
Creatinine, Ser: 0.79 mg/dL (ref 0.50–1.35)
GFR calc Af Amer: >90 mL/min (ref >90)
Potassium: 3.8 mEq/L (ref 3.5–5.1)
Sodium: 139 mEq/L (ref 135–145)
Total Protein: 8.5 g/dL — ABNORMAL HIGH (ref 6.0–8.3)

## 2012-10-05 MED ORDER — HYDROXYUREA 500 MG PO CAPS: 500.0000 mg | ORAL_CAPSULE | Freq: Every day | ORAL | Status: DC

## 2012-10-05 MED ORDER — FOLIC ACID 1 MG PO TABS: 1.0000 mg | ORAL_TABLET | Freq: Every day | ORAL | Status: DC

## 2012-10-05 MED ORDER — ASPIRIN EC 81 MG PO TBEC: 81.0000 mg | DELAYED_RELEASE_TABLET | Freq: Every day | ORAL | Status: DC

## 2012-10-05 NOTE — Progress Notes (Signed)
[facility]  Procedure Note  Kerry Hill ZOX:[id number] DOB: 1980/10/10 DOA: 10/05/2012   PCP: Marthann Schiller MD  Associated Diagnosis: Sickle Cell Disease without crisis   Procedure Note: Lab blood draw   Condition During Procedure: tolerated well   Condition at Discharge: Tolerated well, No apparent distress or complaints. Patient left day [facility] hospital ambulatory with belongings via self.    Allyn Kenner, RN  [facility]
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
## 2012-10-06 ENCOUNTER — Telehealth (HOSPITAL_COMMUNITY): Payer: Self-pay

## 2012-10-06 LAB — FERRITIN: Ferritin: 111 ng/mL (ref 22–322)

## 2012-10-06 NOTE — Telephone Encounter (Signed)
This CM faxed request for provider change for [facility]

## 2012-10-07 ENCOUNTER — Telehealth (HOSPITAL_COMMUNITY): Payer: Self-pay

## 2012-10-07 LAB — HEMOGLOBINOPATHY EVALUATION
Hgb F Quant: 7.1 % — ABNORMAL HIGH (ref 0.0–2.0)
Hgb S Quant: 89 % — ABNORMAL HIGH

## 2012-10-07 NOTE — Telephone Encounter (Signed)
Referral Followup:  This CM received faxed confirmation from [facility] that Kerry Hill is aware of his appointment on 10/19/12@ 10 am with their office.

## 2012-10-10 ENCOUNTER — Emergency Department (HOSPITAL_COMMUNITY)
Admission: EM | Admit: 2012-10-10 | Discharge: 2012-10-10 | Disposition: A | Discharge status: Home / Self Care | Payer: Medicaid Other | Attending: Emergency Medicine | Admitting: Emergency Medicine

## 2012-10-10 ENCOUNTER — Encounter (HOSPITAL_COMMUNITY): Payer: Self-pay | Admitting: Emergency Medicine

## 2012-10-10 DIAGNOSIS — Z87891 Personal history of nicotine dependence: Secondary | ICD-10-CM | POA: Insufficient documentation

## 2012-10-10 DIAGNOSIS — D571 Sickle-cell disease without crisis: Secondary | ICD-10-CM | POA: Insufficient documentation

## 2012-10-10 DIAGNOSIS — Z8709 Personal history of other diseases of the respiratory system: Secondary | ICD-10-CM | POA: Insufficient documentation

## 2012-10-10 DIAGNOSIS — Z7982 Long term (current) use of aspirin: Secondary | ICD-10-CM | POA: Insufficient documentation

## 2012-10-10 DIAGNOSIS — D5701 Hb-SS disease with acute chest syndrome: Secondary | ICD-10-CM | POA: Insufficient documentation

## 2012-10-10 DIAGNOSIS — Z79899 Other long term (current) drug therapy: Secondary | ICD-10-CM | POA: Insufficient documentation

## 2012-10-10 DIAGNOSIS — R42 Dizziness and giddiness: Secondary | ICD-10-CM

## 2012-10-10 LAB — COMPREHENSIVE METABOLIC PANEL
AST: 39 U/L — ABNORMAL HIGH (ref 0–37)
Alkaline Phosphatase: 57 U/L (ref 39–117)
BUN: 5 mg/dL — ABNORMAL LOW (ref 6–23)
CO2: 25 mEq/L (ref 19–32)
Chloride: 103 mEq/L (ref 96–112)
Creatinine, Ser: 0.59 mg/dL (ref 0.50–1.35)
GFR calc non Af Amer: >90 mL/min (ref >90)
Potassium: 3.5 mEq/L (ref 3.5–5.1)
Total Bilirubin: 6.7 mg/dL — ABNORMAL HIGH (ref 0.3–1.2)

## 2012-10-10 LAB — CBC
HCT: 22.7 % — ABNORMAL LOW (ref 39.0–52.0)
MCV: 72.3 fL — ABNORMAL LOW (ref 78.0–100.0)
Platelets: 255 10*3/uL (ref 150–400)
RBC: 3.14 MIL/uL — ABNORMAL LOW (ref 4.22–5.81)
WBC: 12 10*3/uL — ABNORMAL HIGH (ref 4.0–10.5)

## 2012-10-10 LAB — RETICULOCYTES: RBC.: 3.14 MIL/uL — ABNORMAL LOW (ref 4.22–5.81)

## 2012-10-10 MED ORDER — MECLIZINE HCL 25 MG PO TABS
25.0000 mg | ORAL_TABLET | Freq: Once | ORAL | Status: AC
  Administered 2012-10-10: 25 mg via ORAL
  Filled 2012-10-10: qty 1

## 2012-10-10 MED ORDER — DIAZEPAM 5 MG PO TABS
5.0000 mg | ORAL_TABLET | Freq: Once | ORAL | Status: AC
  Administered 2012-10-10: 5 mg via ORAL
  Filled 2012-10-10: qty 1

## 2012-10-10 MED ORDER — MECLIZINE HCL 50 MG PO TABS: 50.0000 mg | ORAL_TABLET | Freq: Three times a day (TID) | ORAL | Status: DC | PRN
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
## 2012-10-10 NOTE — ED Notes (Signed)
Per pt, "feel out" this am while walking to bathroom-has been treated in past-has not seen neuro

## 2012-10-19 ENCOUNTER — Ambulatory Visit: Payer: Medicaid Other | Admitting: Internal Medicine

## 2012-10-19 ENCOUNTER — Ambulatory Visit (INDEPENDENT_AMBULATORY_CARE_PROVIDER_SITE_OTHER): Payer: Medicaid Other | Admitting: Neurology

## 2012-10-19 ENCOUNTER — Encounter: Payer: Self-pay | Admitting: Neurology

## 2012-10-19 VITALS — BP 127/66 | HR 80 | Temp 98.5°F | Ht 70.5 in | Wt 230.0 lb

## 2012-10-19 DIAGNOSIS — H8110 Benign paroxysmal vertigo, unspecified ear: Secondary | ICD-10-CM

## 2012-10-19 DIAGNOSIS — R0989 Other specified symptoms and signs involving the circulatory and respiratory systems: Secondary | ICD-10-CM

## 2012-10-19 DIAGNOSIS — G4733 Obstructive sleep apnea (adult) (pediatric): Secondary | ICD-10-CM

## 2012-10-19 DIAGNOSIS — Z8673 Personal history of transient ischemic attack (TIA), and cerebral infarction without residual deficits: Secondary | ICD-10-CM

## 2012-10-19 DIAGNOSIS — D571 Sickle-cell disease without crisis: Secondary | ICD-10-CM

## 2012-10-19 NOTE — Patient Instructions (Signed)
I think overall you are doing fairly well but I do want to suggest a few things today:  Remember to drink plenty of fluid, eat healthy meals and do not skip any meals. Try to eat protein with a every meal and eat a healthy snack such as fruit or nuts in between meals. Try to keep a regular sleep-wake schedule and try to exercise daily, particularly in the form of walking, 20-30 minutes a day, if you can. Change positions slowly.   Engage in social activities in your community and with your family and try to keep up with current events by reading the newspaper or watching the news.   As far as your medications are concerned, I would like to suggest taking a baby aspirin daily.   As discussed, secondary prevention is key after a stroke. This means: taking care of blood sugar values or diabetes management, good blood pressure (hypertension) control and optimizing cholesterol management, exercising daily or regularly within your own mobility limitations of course and overall cardiovascular risk factor reduction, which includes screening for and treatment of obstructive sleep apnea (OSA).   Based on your symptoms and your exam I believe you are at risk for obstructive sleep apnea or OSA, and I think we should proceed with a sleep study to determine whether you do or do not have OSA and how severe it is. If you have more than mild OSA, I want you to consider treatment with CPAP. Please remember, the risks and ramifications of moderate to severe obstructive sleep apnea or OSA are: Cardiovascular disease, including congestive heart failure, stroke, difficult to control hypertension, arrhythmias, and even type 2 diabetes has been linked to untreated OSA. Sleep apnea causes disruption of sleep and sleep deprivation in most cases, which, in turn, can cause recurrent headaches, problems with memory, mood, concentration, focus, and vigilance. Most people with untreated sleep apnea report excessive daytime sleepiness,  which can affect their ability to drive. Please do not drive if you feel sleepy.  I will see you back after your sleep study to go over the test results and where to go from there. We will call you after your sleep study and to set up an appointment at the time.   As far as diagnostic testing: sleep study, MRA head and neck, echocardiogram, referral to PT and to ENT doctor.   I would like to see you back in 3 months, sooner if we need to. Please call us with any interim questions, concerns, problems, updates or refill requests.  Please also call us for any test results so we can go over those with you on the phone. Brett Canales is my clinical assistant and will answer any of your questions and relay your messages to me and also relay most of my messages to you.  Our phone number is [phone]. We also have an after hours call service for urgent matters and there is a physician on-call for urgent questions. For any emergencies you know to call 911 or go to the nearest emergency room.
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
## 2012-10-20 LAB — LIPID PANEL
Chol/HDL Ratio: 4.1 ratio units (ref 0.0–5.0)
Cholesterol, Total: 126 mg/dL (ref 100–199)
HDL: 31 mg/dL — ABNORMAL LOW (ref >39)
Triglycerides: 147 mg/dL (ref 0–149)

## 2012-10-20 LAB — RPR: RPR: NONREACTIVE

## 2012-10-21 NOTE — Progress Notes (Signed)
Quick Note:  Please call and advise the patient that the recent labs we checked were within normal limits. No further action is required on these tests at this time. Please remind patient to keep any upcoming appointments or tests and to call us with any interim questions, concerns, problems or updates. Thanks,  Seng Fouts, MD, PhD    ______ 

## 2012-10-22 ENCOUNTER — Ambulatory Visit (INDEPENDENT_AMBULATORY_CARE_PROVIDER_SITE_OTHER): Payer: Medicaid Other | Admitting: Neurology

## 2012-10-22 DIAGNOSIS — R0989 Other specified symptoms and signs involving the circulatory and respiratory systems: Secondary | ICD-10-CM

## 2012-10-22 DIAGNOSIS — G4736 Sleep related hypoventilation in conditions classified elsewhere: Secondary | ICD-10-CM

## 2012-10-22 DIAGNOSIS — H8110 Benign paroxysmal vertigo, unspecified ear: Secondary | ICD-10-CM

## 2012-10-22 DIAGNOSIS — G4733 Obstructive sleep apnea (adult) (pediatric): Secondary | ICD-10-CM

## 2012-10-22 DIAGNOSIS — G4720 Circadian rhythm sleep disorder, unspecified type: Secondary | ICD-10-CM

## 2012-10-22 DIAGNOSIS — Z8673 Personal history of transient ischemic attack (TIA), and cerebral infarction without residual deficits: Secondary | ICD-10-CM

## 2012-10-25 NOTE — Progress Notes (Signed)
Quick Note:  Spoke with patient and relayed results of blood work. The patient was also reminded of any future appointments. Patient understood and had no questions.  ______ 

## 2012-10-29 ENCOUNTER — Ambulatory Visit: Payer: Medicaid Other

## 2012-10-29 ENCOUNTER — Telehealth: Payer: Self-pay | Admitting: Neurology

## 2012-10-29 DIAGNOSIS — G4733 Obstructive sleep apnea (adult) (pediatric): Secondary | ICD-10-CM

## 2012-10-29 NOTE — Telephone Encounter (Signed)
Please call and notify the patient that the recent sleep study did confirm the diagnosis of obstructive sleep apnea and that I recommend treatment for this in the form of CPAP. This will require a repeat sleep study for proper titration and mask fitting. Please explain to patient and arrange for a CPAP titration study. I have placed an order in the chart. Thanks, Kassadi Presswood, MD, PhD [facility]  

## 2012-10-31 NOTE — Telephone Encounter (Signed)
Left message for patient regarding sleep study results, asked patient to call me back to discuss results and have questions answered.  Explained that a copy of the sleep study was sent to referring physician and copy of study is coming to them in the mail. Pt msg on voicemail states pt needs to call us to schedule follow up treatment sleep study.  A copy of the sleep study interpretive report as well as a letter with info pertaining to results and the need for the follow up appointment info will be mailed to the patient's home.

## 2012-11-01 ENCOUNTER — Ambulatory Visit: Payer: Medicaid Other | Attending: Neurology | Admitting: Rehabilitative and Restorative Service Providers"

## 2012-11-01 DIAGNOSIS — IMO0001 Reserved for inherently not codable concepts without codable children: Secondary | ICD-10-CM | POA: Insufficient documentation

## 2012-11-01 DIAGNOSIS — R42 Dizziness and giddiness: Secondary | ICD-10-CM | POA: Insufficient documentation

## 2012-11-01 DIAGNOSIS — H8110 Benign paroxysmal vertigo, unspecified ear: Secondary | ICD-10-CM | POA: Insufficient documentation

## 2012-11-02 NOTE — Telephone Encounter (Signed)
Left message for patient asking him to return my call in order to discuss sleep study results and recommendations.  Also stated that if patient prefers, Dr. Frances Furbish has some openings this week and patient can schedule a time to meet with her to discuss results and recommendations in the office. Asked patient to call.

## 2012-11-03 ENCOUNTER — Encounter: Payer: Self-pay | Admitting: *Deleted

## 2012-11-03 NOTE — Telephone Encounter (Signed)
Pt returned phone call, we discussed sleep study results and detail and all questions were answered.  He scheduled a follow up titration appointment for Monday 11/15/12 at 9:30 PM. -sh

## 2012-11-04 ENCOUNTER — Ambulatory Visit
Admission: RE | Admit: 2012-11-04 | Discharge: 2012-11-04 | Disposition: A | Discharge status: Home / Self Care | Payer: Medicaid Other | Source: Ambulatory Visit | Attending: Neurology | Admitting: Neurology

## 2012-11-04 ENCOUNTER — Other Ambulatory Visit: Payer: Self-pay | Admitting: Neurology

## 2012-11-04 ENCOUNTER — Ambulatory Visit: Payer: Medicaid Other

## 2012-11-04 ENCOUNTER — Other Ambulatory Visit: Payer: Medicaid Other

## 2012-11-04 ENCOUNTER — Inpatient Hospital Stay: Admission: RE | Admit: 2012-11-04 | Payer: Medicaid Other | Source: Ambulatory Visit

## 2012-11-04 DIAGNOSIS — H8110 Benign paroxysmal vertigo, unspecified ear: Secondary | ICD-10-CM

## 2012-11-04 DIAGNOSIS — R42 Dizziness and giddiness: Secondary | ICD-10-CM

## 2012-11-04 MED ORDER — GADOBENATE DIMEGLUMINE 529 MG/ML IV SOLN
20.0000 mL | Freq: Once | INTRAVENOUS | Status: AC | PRN
  Administered 2012-11-04: 20 mL via INTRAVENOUS

## 2012-11-05 ENCOUNTER — Encounter: Payer: Self-pay | Admitting: Internal Medicine

## 2012-11-05 ENCOUNTER — Ambulatory Visit: Payer: Medicaid Other | Admitting: Physical Therapy

## 2012-11-10 ENCOUNTER — Ambulatory Visit: Payer: Medicaid Other | Admitting: Internal Medicine

## 2012-11-10 NOTE — Progress Notes (Signed)
Quick Note:  Please advise patient that his brain and neck blood vessel imaging tests were unremarkable, indicating no significant tightening of his arteries in his neck or any aneurysms in his brain. Nevertheless, because of his medical history of sickle cell disease and prior strokes I would like to go ahead and add 2 additional tests which are ultrasound based. This will have a more functional look at his blood vessels in his brain and these are tests that we can do in our office. We do have to schedule him for these. They are called transcranial Doppler testing or TCD. Please advise patient that I have ordered these tests so we can have his workup completed. ______

## 2012-11-12 NOTE — Progress Notes (Signed)
Quick Note:  I called pt and relayed the results of MRA neck and head (normal). Informed of recommendations for TCD emboli and bubble study. Pt to be called for scheduling. He stated that he had 1-2 yrs ago at [facility] a 2D echocardiogram per Hematology Rosine Abe). . Will try to get report, may need him to sign release. ______

## 2012-11-15 ENCOUNTER — Telehealth: Payer: Self-pay | Admitting: Neurology

## 2012-11-16 ENCOUNTER — Ambulatory Visit (INDEPENDENT_AMBULATORY_CARE_PROVIDER_SITE_OTHER): Payer: Medicaid Other

## 2012-11-16 DIAGNOSIS — G4733 Obstructive sleep apnea (adult) (pediatric): Secondary | ICD-10-CM

## 2012-11-17 ENCOUNTER — Ambulatory Visit: Payer: Medicaid Other | Admitting: Internal Medicine

## 2012-11-25 ENCOUNTER — Other Ambulatory Visit (HOSPITAL_COMMUNITY): Payer: Medicaid Other

## 2012-11-25 ENCOUNTER — Other Ambulatory Visit (HOSPITAL_COMMUNITY): Payer: Self-pay | Admitting: Neurology

## 2012-11-25 DIAGNOSIS — I6350 Cerebral infarction due to unspecified occlusion or stenosis of unspecified cerebral artery: Secondary | ICD-10-CM

## 2012-12-01 ENCOUNTER — Encounter: Payer: Self-pay | Admitting: *Deleted

## 2012-12-01 ENCOUNTER — Telehealth: Payer: Self-pay | Admitting: Neurology

## 2012-12-01 DIAGNOSIS — G4733 Obstructive sleep apnea (adult) (pediatric): Secondary | ICD-10-CM

## 2012-12-01 NOTE — Telephone Encounter (Signed)
I called and spoke with the patient about his CPAP titration study. I informed the patient that he did well on CPAP and that Dr. Frances Furbish would like for him to start CPAP therapy at home. Patient understood and I will send a copy of the report in the mail and fax a copy to [facility] office

## 2012-12-01 NOTE — Telephone Encounter (Signed)
Please call and inform patient that I have entered an order for treatment with PAP. We will arrange for a machine through a DME company and I will see the patient back in follow-up in about 6 weeks. I will be looking out for compliance data downloaded from the machine at the time of the followup appointment and discuss how it is going with PAP treatment at the time of the visit. Please also make sure, the patient has a follow-up appointment with me in 6 weeks from the setup date, thanks.  Anali Cabanilla, MD, PhD [facility]  

## 2012-12-03 ENCOUNTER — Telehealth: Payer: Self-pay | Admitting: *Deleted

## 2012-12-03 NOTE — Telephone Encounter (Signed)
Call pt to confirm PCP spoke his cousin will give him the message to return call to [facility]

## 2012-12-13 ENCOUNTER — Other Ambulatory Visit: Payer: Self-pay | Admitting: Neurology

## 2012-12-13 DIAGNOSIS — I6350 Cerebral infarction due to unspecified occlusion or stenosis of unspecified cerebral artery: Secondary | ICD-10-CM

## 2013-02-09 ENCOUNTER — Telehealth: Payer: Self-pay | Admitting: Internal Medicine

## 2013-02-09 NOTE — Telephone Encounter (Signed)
Left message for patient to call for follow up appointment. 

## 2013-03-01 ENCOUNTER — Telehealth: Payer: Self-pay | Admitting: Internal Medicine

## 2013-03-01 NOTE — Telephone Encounter (Signed)
Left message for patient to call to reschedule CPE.

## 2013-03-02 ENCOUNTER — Encounter: Payer: Medicaid Other | Admitting: Internal Medicine

## 2013-08-25 ENCOUNTER — Encounter (HOSPITAL_COMMUNITY): Payer: Self-pay | Admitting: Emergency Medicine

## 2013-08-25 ENCOUNTER — Emergency Department (HOSPITAL_COMMUNITY)
Admission: EM | Admit: 2013-08-25 | Discharge: 2013-08-25 | Disposition: A | Discharge status: Home / Self Care | Payer: Medicaid Other | Attending: Emergency Medicine | Admitting: Emergency Medicine

## 2013-08-25 DIAGNOSIS — Z79899 Other long term (current) drug therapy: Secondary | ICD-10-CM | POA: Diagnosis not present

## 2013-08-25 DIAGNOSIS — D5700 Hb-SS disease with crisis, unspecified: Secondary | ICD-10-CM | POA: Insufficient documentation

## 2013-08-25 DIAGNOSIS — N4830 Priapism, unspecified: Secondary | ICD-10-CM | POA: Diagnosis not present

## 2013-08-25 DIAGNOSIS — Z87891 Personal history of nicotine dependence: Secondary | ICD-10-CM | POA: Insufficient documentation

## 2013-08-25 DIAGNOSIS — Z7982 Long term (current) use of aspirin: Secondary | ICD-10-CM | POA: Diagnosis not present

## 2013-08-25 LAB — COMPREHENSIVE METABOLIC PANEL
ALBUMIN: 4.4 g/dL (ref 3.5–5.2)
ALK PHOS: 60 U/L (ref 39–117)
ALT: 16 U/L (ref 0–53)
ANION GAP: 12 (ref 5–15)
AST: 32 U/L (ref 0–37)
BUN: 5 mg/dL — ABNORMAL LOW (ref 6–23)
CALCIUM: 8.6 mg/dL (ref 8.4–10.5)
CO2: 23 mEq/L (ref 19–32)
Chloride: 103 mEq/L (ref 96–112)
Creatinine, Ser: 0.71 mg/dL (ref 0.50–1.35)
GFR calc Af Amer: >90 mL/min (ref >90)
GFR calc non Af Amer: >90 mL/min (ref >90)
Glucose, Bld: 150 mg/dL — ABNORMAL HIGH (ref 70–99)
POTASSIUM: 3.6 meq/L — AB (ref 3.7–5.3)
SODIUM: 138 meq/L (ref 137–147)
Total Bilirubin: 7.9 mg/dL — ABNORMAL HIGH (ref 0.3–1.2)
Total Protein: 8.1 g/dL (ref 6.0–8.3)

## 2013-08-25 LAB — RETICULOCYTES
RBC.: 3.57 MIL/uL — AB (ref 4.22–5.81)
Retic Count, Absolute: 553.4 10*3/uL — ABNORMAL HIGH (ref 19.0–186.0)
Retic Ct Pct: 15.5 % — ABNORMAL HIGH (ref 0.4–3.1)

## 2013-08-25 LAB — CBC WITH DIFFERENTIAL/PLATELET
BASOS PCT: 1 % (ref 0–1)
Basophils Absolute: 0.1 10*3/uL (ref 0.0–0.1)
Eosinophils Absolute: 0.2 10*3/uL (ref 0.0–0.7)
Eosinophils Relative: 2 % (ref 0–5)
HCT: 26.2 % — ABNORMAL LOW (ref 39.0–52.0)
HEMOGLOBIN: 8.8 g/dL — AB (ref 13.0–17.0)
LYMPHS PCT: 38 % (ref 12–46)
Lymphs Abs: 3.7 10*3/uL (ref 0.7–4.0)
MCH: 24.6 pg — ABNORMAL LOW (ref 26.0–34.0)
MCHC: 33.6 g/dL (ref 30.0–36.0)
MCV: 73.4 fL — ABNORMAL LOW (ref 78.0–100.0)
Monocytes Absolute: 1.6 10*3/uL — ABNORMAL HIGH (ref 0.1–1.0)
Monocytes Relative: 17 % — ABNORMAL HIGH (ref 3–12)
NEUTROS ABS: 4.1 10*3/uL (ref 1.7–7.7)
Neutrophils Relative %: 42 % — ABNORMAL LOW (ref 43–77)
Platelets: 317 10*3/uL (ref 150–400)
RBC: 3.57 MIL/uL — ABNORMAL LOW (ref 4.22–5.81)
RDW: 25.4 % — ABNORMAL HIGH (ref 11.5–15.5)
WBC: 9.7 10*3/uL (ref 4.0–10.5)

## 2013-08-25 MED ORDER — SODIUM CHLORIDE 0.9 % IV BOLUS (SEPSIS)
1000.0000 mL | Freq: Once | INTRAVENOUS | Status: AC
  Administered 2013-08-25: 1000 mL via INTRAVENOUS

## 2013-08-25 MED ORDER — PSEUDOEPHEDRINE HCL 60 MG PO TABS
60.0000 mg | ORAL_TABLET | Freq: Once | ORAL | Status: AC
  Administered 2013-08-25: 60 mg via ORAL
  Filled 2013-08-25: qty 1

## 2013-08-25 MED ORDER — HYDROMORPHONE HCL PF 1 MG/ML IJ SOLN
1.0000 mg | INTRAMUSCULAR | Status: DC | PRN
  Administered 2013-08-25: 1 mg via INTRAVENOUS
  Filled 2013-08-25: qty 1

## 2013-08-25 MED ORDER — ONDANSETRON HCL 4 MG/2ML IJ SOLN
4.0000 mg | Freq: Once | INTRAMUSCULAR | Status: AC
Start: 2013-08-25 — End: 2013-08-25
  Administered 2013-08-25: 4 mg via INTRAVENOUS
  Filled 2013-08-25: qty 2

## 2013-08-25 MED ORDER — SODIUM CHLORIDE 0.9 % IV SOLN: Freq: Once | INTRAVENOUS | Status: DC

## 2013-08-25 MED ORDER — PHENYLEPHRINE 200 MCG/ML FOR PRIAPISM / HYPOTENSION
200.0000 ug | INTRAMUSCULAR | Status: DC | PRN
  Administered 2013-08-25: 200 ug via INTRACAVERNOUS
  Filled 2013-08-25: qty 50

## 2013-08-25 NOTE — Discharge Instructions (Signed)
Continue your current prescribed medications. Re check with  Dr. Ashley RoyaltyMatthews at the sickle cell clinic.  Priapism Priapism is a persistent, often painful erection. It is the hardening of the penis in males and of the clitoris in females, even without sexual stimulation. Priapism may come on suddenly. Priapism may last a short while, or may last a long time. Priapism occurs in all ages. The types of priapism include:  Acute prolonged priapism--Priapism that comes on suddenly and lasts.  Recurrent acute priapism--Priapism that comes on suddenly and tends to happen again.  Chronic priapism--Priapism that is persistent but with less of an erection. CAUSES  There are many causes. Causes include:  Blood problems common in people with the following diseases:  Sickle cell disease.  Leukemia.  Side effects of erectile dysfunction medicine. This is the most common cause of priapism.  Side effects of prescription medicine used in the treatment of depression and anxiety.  Illegal use of street drugs such as cocaine and marijuana.  Excessive use of alcohol.  Neurological problems such as multiple sclerosis.  Diabetes mellitus.  The cause may be unknown. SIGNS AND SYMPTOMS  A prolonged erection, usually without sexual stimulation or following the use of erectile dysfunction medicine.  A painful erection. DIAGNOSIS Diagnosis of priapism can usually be confirmed by your health care provider after a physical exam. Your health care provider may have blood tests done to search for a potential cause, such as leukemia or sickle cell disease. TREATMENT  Treatments depend on the cause. Some specific treatments include:  Oxygen and red blood cell transfusions in patients with sickle cell disease.  A special treatment for plasma in those with leukemia.  Removing blood that is trapped.  Treatment with medicine.  Surgical shunting (a passage that is made to allow blood to flow from one part of  the body to another). HOME CARE INFORMATION  Avoid sexual stimulation and intercourse until your health care provider says it is okay.  Avoid the use of alcohol or drugs to minimize recurrence of priapism. SEEK MEDICAL CARE IF:  You experience worsening pain instead of improvement. SEEK IMMEDIATE MEDICAL CARE IF:  You experience fever or shaking chills.  You experience pain, swelling, or redness in your genital or groin area. MAKE SURE YOU:  Understand these instructions.   Will watch your condition.  Will get help right away if you are not doing well or get worse. Document Released: 03/29/2003 Document Revised: 10/27/2012 Document Reviewed: 06/10/2012 [facility] Patient Information 2015 [employer], [place]. This information is not intended to replace advice given to you by your health care provider. Make sure you discuss any questions you have with your health care provider.

## 2013-08-25 NOTE — ED Provider Notes (Addendum)
CSN: [id number]     Arrival date & time 08/25/13  1250 History   First MD Initiated Contact with Patient 08/25/13 1255     Chief Complaint  Patient presents with  . Sickle Cell Pain Crisis      HPI  Vision presents with a priapism since 6 AM. Presents here at 1300.  This is not unusual for him. This happens several days per week. States [facility]. [place] in [place]. Symptoms not resolve today he presents here.  Has been able to urinate. Complains of severe pain.  States he is "not very good" about taking his typical medications.   Past Medical History  Diagnosis Date  . Sickle cell disease   . Acute chest syndrome(517.3)     ? in past   Past Surgical History  Procedure Laterality Date  . Appendectomy    . Gall stones    . Mini shunt insertion    . Tonsillectomy     No family history on file. History  Substance Use Topics  . Smoking status: Former Smoker    Types: Cigarettes    Quit date: 10/02/2008  . Smokeless tobacco: Never Used  . Alcohol Use: Yes     Comment: socially but none recently    Review of Systems  Constitutional: Negative for fever, chills, diaphoresis, appetite change and fatigue.  HENT: Negative for mouth sores, sore throat and trouble swallowing.   Eyes: Negative for visual disturbance.  Respiratory: Negative for cough, chest tightness, shortness of breath and wheezing.   Cardiovascular: Negative for chest pain.  Gastrointestinal: Negative for nausea, vomiting, abdominal pain, diarrhea and abdominal distention.  Endocrine: Negative for polydipsia, polyphagia and polyuria.  Genitourinary: Negative for dysuria, frequency and hematuria.       Priapism  Musculoskeletal: Negative for gait problem.  Skin: Negative for color change, pallor and rash.  Neurological: Negative for dizziness, syncope, light-headedness and headaches.  Hematological: Does not bruise/bleed easily.  Psychiatric/Behavioral:  Negative for behavioral problems and confusion.      Allergies  Review of patient's allergies indicates no known allergies.  Home Medications   Prior to Admission medications   Medication Sig Start Date End Date Taking? Authorizing Provider  oxyCODONE (ROXICODONE) 15 MG immediate release tablet Take 1 tablet (15 mg total) by mouth every 4 (four) hours as needed for pain. 03/31/12  Yes Zannie CovePreetha Joseph, MD   BP 128/64  Pulse 83  Temp(Src) 99 F (37.2 C) (Oral)  Resp 17  SpO2 93% Physical Exam  Constitutional:  Appears uncomfortable  Genitourinary:  Tense priapism    ED Course  Irrigate corpus cavern, priapism Date/Time: 08/25/2013 1:24 PM Performed by: Rolland PorterJAMES, Giovan Pinsky Authorized by: Rolland PorterJAMES, Juliany Daughety Consent: Verbal consent obtained. written consent obtained. Risks and benefits: risks, benefits and alternatives were discussed Consent given by: patient Patient understanding: patient states understanding of the procedure being performed Patient identity confirmed: verbally with patient Time out: Immediately prior to procedure a "time out" was called to verify the correct patient, procedure, equipment, support staff and site/side marked as required. Preparation: Patient was prepped and draped in the usual sterile fashion. Local anesthesia used: no Patient sedated: no Patient tolerance: Patient tolerated the procedure well with no immediate complications. Comments: 200mcg in 1Ml NS Phenylephrine injected onto Rt corpora using aseptic technique. Pressure held to injection site for 2 minutes.  No immediate hematoma noted.     (including critical care time) Labs Review Labs Reviewed  CBC  WITH DIFFERENTIAL - Abnormal; Notable for the following:    RBC 3.57 (*)    Hemoglobin 8.8 (*)    HCT 26.2 (*)    MCV 73.4 (*)    MCH 24.6 (*)    RDW 25.4 (*)    Neutrophils Relative % 42 (*)    Monocytes Relative 17 (*)    Monocytes Absolute 1.6 (*)    All other components within normal limits   RETICULOCYTES - Abnormal; Notable for the following:    Retic Ct Pct 15.5 (*)    RBC. 3.57 (*)    Retic Count, Manual 553.4 (*)    All other components within normal limits  COMPREHENSIVE METABOLIC PANEL - Abnormal; Notable for the following:    Potassium 3.6 (*)    Glucose, Bld 150 (*)    BUN 5 (*)    Total Bilirubin 7.9 (*)    All other components within normal limits    Imaging Review No results found.   EKG Interpretation None      MDM   Final diagnoses:  Priapism    14:00:  Pt eatind [employer].  Asked to remain NPO until Priapism resolved.  Sates "better" Exam shows incomplete detumesence.    14:40:  Patient again found in his room  eating. Exam shows complete detumescence. Labs are reassuring. Plan is discharge home.    Rolland Porter, MD 08/25/13 1441  Rolland Porter, MD 08/25/13 1442  Rolland Porter, MD 08/25/13 [phone]

## 2013-08-25 NOTE — ED Notes (Addendum)
Pt reports priapism since 0600 this am. Pt reports hx of sickle cell. Pt reports took 15 mg of IR oxycodone and 2 mg of dilaudid with no relief to pain. Pt reports pain medication usually helps priapism subside by 1000 or 1100 but not this time. Pt denies other complaint or pain at present time. Primary nurse at bedside with phenylephrine kit.  Pt denies CP or SOB.

## 2013-08-25 NOTE — ED Notes (Signed)
MD at bedside. 

## 2013-11-15 ENCOUNTER — Emergency Department (HOSPITAL_COMMUNITY)
Admission: EM | Admit: 2013-11-15 | Discharge: 2013-11-15 | Disposition: A | Discharge status: Home / Self Care | Payer: Medicaid Other | Attending: Emergency Medicine | Admitting: Emergency Medicine

## 2013-11-15 ENCOUNTER — Encounter (HOSPITAL_COMMUNITY): Payer: Self-pay | Admitting: Emergency Medicine

## 2013-11-15 DIAGNOSIS — Z87891 Personal history of nicotine dependence: Secondary | ICD-10-CM | POA: Insufficient documentation

## 2013-11-15 DIAGNOSIS — N4830 Priapism, unspecified: Secondary | ICD-10-CM | POA: Diagnosis not present

## 2013-11-15 DIAGNOSIS — D5700 Hb-SS disease with crisis, unspecified: Secondary | ICD-10-CM | POA: Diagnosis present

## 2013-11-15 MED ORDER — PHENYLEPHRINE 200 MCG/ML FOR PRIAPISM / HYPOTENSION
500.0000 ug | INTRAMUSCULAR | Status: DC | PRN
Start: 2013-11-15 — End: 2013-11-15
  Administered 2013-11-15: 200 ug via INTRACAVERNOUS
  Filled 2013-11-15: qty 50

## 2013-11-15 NOTE — ED Provider Notes (Signed)
Medical screening examination/treatment/procedure(s) were conducted as a shared visit with non-physician practitioner(s) and myself.  I personally evaluated the patient during the encounter.   EKG Interpretation None       Doug SouSam Niang Mitcheltree, MD 11/15/13 [phone]

## 2013-11-15 NOTE — ED Notes (Addendum)
Pt presents with sickle cell crisis since 3 am. Hx of priapism.  Today with the same. Took oxycodone at 3am without relief.  Denies SOB/CP ar present.

## 2013-11-15 NOTE — ED Notes (Signed)
MD at bedside. [facility] PRES[facility]

## 2013-11-15 NOTE — Discharge Instructions (Signed)
Priapism °Priapism is a persistent, often painful erection. It is the hardening of the penis in males and of the clitoris in females, even without sexual stimulation. Priapism may come on suddenly. Priapism may last a short while, or may last a long time. Priapism occurs in all ages. The types of priapism include: °· Acute prolonged priapism--Priapism that comes on suddenly and lasts. °· Recurrent acute priapism--Priapism that comes on suddenly and tends to happen again. °· Chronic priapism--Priapism that is persistent but with less of an erection. °CAUSES  °There are many causes. Causes include: °· Blood problems common in people with the following diseases: °¨ Sickle cell disease. °¨ Leukemia. °· Side effects of erectile dysfunction medicine. This is the most common cause of priapism. °· Side effects of prescription medicine used in the treatment of depression and anxiety. °· Illegal use of street drugs such as cocaine and marijuana. °· Excessive use of alcohol. °· Neurological problems such as multiple sclerosis. °· Diabetes mellitus. °· The cause may be unknown. °SIGNS AND SYMPTOMS °· A prolonged erection, usually without sexual stimulation or following the use of erectile dysfunction medicine. °· A painful erection. °DIAGNOSIS °Diagnosis of priapism can usually be confirmed by your health care provider after a physical exam. Your health care provider may have blood tests done to search for a potential cause, such as leukemia or sickle cell disease. °TREATMENT  °Treatments depend on the cause. Some specific treatments include: °· Oxygen and red blood cell transfusions in patients with sickle cell disease. °· A special treatment for plasma in those with leukemia. °· Removing blood that is trapped. °· Treatment with medicine. °· Surgical shunting (a passage that is made to allow blood to flow from one part of the body to another). °HOME CARE INFORMATION °· Avoid sexual stimulation and intercourse until your health  care provider says it is okay. °· Avoid the use of alcohol or drugs to minimize recurrence of priapism. °SEEK MEDICAL CARE IF: °· You experience worsening pain instead of improvement. °SEEK IMMEDIATE MEDICAL CARE IF: °· You experience fever or shaking chills. °· You experience pain, swelling, or redness in your genital or groin area. °MAKE SURE YOU: °· Understand these instructions.   °· Will watch your condition. °· Will get help right away if you are not doing well or get worse. °Document Released: 03/29/2003 Document Revised: 10/27/2012 Document Reviewed: 06/10/2012 °ExitCare® Patient Information ©2015 [employer], [employer]. This information is not intended to replace advice given to you by your health care provider. Make sure you discuss any questions you have with your health care provider. ° °

## 2013-11-15 NOTE — ED Provider Notes (Signed)
Patient noted to have priapism he reports onset 2:58 AM today. He says he gets similar symptoms each day usually penis becomes flaccid after treating himself with oxycodone and taking a shower. He took oxycodone this point and showered with no relief.  Doug SouSam [place] Gustafson, MD 11/15/13 [phone]

## 2013-11-15 NOTE — ED Provider Notes (Signed)
CSN: [id number]     Arrival date & time 11/15/13  0747 History   First MD Initiated Contact with Patient 11/15/13 0802     Chief Complaint  Patient presents with  . Sickle Cell Pain Crisis     (Consider location/radiation/quality/duration/timing/severity/associated sxs/prior Treatment) HPI Comments: Patient is a 33 year old male with history of sickle cell disease, acute chest syndrome, priapism. Presents the emergency department today for evaluation of priapism. He reports that this began at 3 AM. This is a persistent problem which generally resolves with hot showers and oxycodone. He showered x 3 and took home oxycodone without relief of his sx. He has needed phenylephrine injections in the past. He denies other symptoms including chest pain, shortness of breath, fevers.   Patient is a 33 y.o. male presenting with sickle cell pain. The history is provided by the patient. No language interpreter was used.  Sickle Cell Pain Crisis Associated symptoms: no chest pain, no fever and no shortness of breath     Past Medical History  Diagnosis Date  . Sickle cell disease   . Acute chest syndrome(517.3)     ? in past   Past Surgical History  Procedure Laterality Date  . Appendectomy    . Gall stones    . Mini shunt insertion    . Tonsillectomy     No family history on file. History  Substance Use Topics  . Smoking status: Former Smoker    Types: Cigarettes    Quit date: 10/02/2008  . Smokeless tobacco: Never Used  . Alcohol Use: Yes     Comment: socially but none recently    Review of Systems  Constitutional: Negative for fever and chills.  Respiratory: Negative for shortness of breath.   Cardiovascular: Negative for chest pain.  Genitourinary: Positive for penile swelling and penile pain.  All other systems reviewed and are negative.     Allergies  Review of patient's allergies indicates no known allergies.  Home Medications   Prior to Admission medications    Medication Sig Start Date End Date Taking? Authorizing Provider  oxyCODONE (ROXICODONE) 15 MG immediate release tablet Take 1 tablet (15 mg total) by mouth every 4 (four) hours as needed for pain. 03/31/12  Yes Zannie CovePreetha Joseph, MD   BP 124/61  Pulse 66  Temp(Src) 97.8 F (36.6 C) (Oral)  Resp 18  Ht 6' (1.829 m)  Wt 224 lb (101.606 kg)  BMI 30.37 kg/m2  SpO2 99% Physical Exam  Nursing note and vitals reviewed. Constitutional: He is oriented to person, place, and time. He appears well-developed and well-nourished. No distress.  HENT:  Head: Normocephalic and atraumatic.  Right Ear: External ear normal.  Left Ear: External ear normal.  Nose: Nose normal.  Eyes: Conjunctivae are normal.  Neck: Normal range of motion. No tracheal deviation present.  Cardiovascular: Normal rate, regular rhythm and normal heart sounds.   Pulmonary/Chest: Effort normal and breath sounds normal. No stridor.  Abdominal: Soft. He exhibits no distension. There is no tenderness.  Genitourinary: Circumcised.  Tense priapism   Musculoskeletal: Normal range of motion.  Neurological: He is alert and oriented to person, place, and time.  Skin: Skin is warm and dry. He is not diaphoretic.  Psychiatric: He has a normal mood and affect. His behavior is normal.    ED Course  Irrigate corpus cavern, priapism Performed by: Mora BellmanMERRELL, Courtnay Petrilla S Authorized by: Mora BellmanMERRELL, Jaylah Goodlow S Consent: Verbal consent obtained. written consent not obtained. The procedure was performed in an emergent  situation. Risks and benefits: risks, benefits and alternatives were discussed Consent given by: patient Patient understanding: patient states understanding of the procedure being performed Patient consent: the patient's understanding of the procedure matches consent given Required items: required blood products, implants, devices, and special equipment available Patient identity confirmed: verbally with patient and arm band Time out:  Immediately prior to procedure a "time out" was called to verify the correct patient, procedure, equipment, support staff and site/side marked as required. Preparation: Patient was prepped and draped in the usual sterile fashion. Local anesthesia used: no Patient sedated: no Patient tolerance: Patient tolerated the procedure well with no immediate complications. Comments: 200mcg in 1 ml NS Phenylephrine injected onto right corpora using aseptic technique. Pressure held to injection site for 2 minutes.  No immediate hematoma noted.    (including critical care time) Labs Review Labs Reviewed - No data to display  Imaging Review No results found.   EKG Interpretation None      MDM   Final diagnoses:  Priapism    Patient presents emergency department for evaluation of priapism. Priapism was injected with 200 g of phenylephrine, diluted in 1 ml of normal saline. Priapism resolved. Patient discharged home in good condition. No complaints at discharge. Dr. Ethelda ChickJacubowitz evaluated patient and agrees with plan. Patient / Family / Caregiver informed of clinical course, understand medical decision-making process, and agree with plan.     Mora BellmanHannah S Izmael Duross, PA-C 11/15/13 [phone]

## 2013-11-16 ENCOUNTER — Encounter (HOSPITAL_COMMUNITY): Payer: Self-pay | Admitting: Emergency Medicine

## 2013-11-16 ENCOUNTER — Emergency Department (HOSPITAL_COMMUNITY)
Admission: EM | Admit: 2013-11-16 | Discharge: 2013-11-16 | Disposition: A | Discharge status: Home / Self Care | Payer: Medicaid Other | Attending: Emergency Medicine | Admitting: Emergency Medicine

## 2013-11-16 DIAGNOSIS — Z87891 Personal history of nicotine dependence: Secondary | ICD-10-CM | POA: Insufficient documentation

## 2013-11-16 DIAGNOSIS — D5700 Hb-SS disease with crisis, unspecified: Secondary | ICD-10-CM

## 2013-11-16 DIAGNOSIS — N4830 Priapism, unspecified: Secondary | ICD-10-CM

## 2013-11-16 LAB — CBC WITH DIFFERENTIAL/PLATELET
BASOS PCT: 0 % (ref 0–1)
Basophils Absolute: 0 10*3/uL (ref 0.0–0.1)
Eosinophils Absolute: 0.3 10*3/uL (ref 0.0–0.7)
Eosinophils Relative: 3 % (ref 0–5)
HEMATOCRIT: 27.6 % — AB (ref 39.0–52.0)
HEMOGLOBIN: 9 g/dL — AB (ref 13.0–17.0)
Lymphocytes Relative: 34 % (ref 12–46)
Lymphs Abs: 3.4 10*3/uL (ref 0.7–4.0)
MCH: 23.9 pg — ABNORMAL LOW (ref 26.0–34.0)
MCHC: 32.6 g/dL (ref 30.0–36.0)
MCV: 73.4 fL — ABNORMAL LOW (ref 78.0–100.0)
MONOS PCT: 16 % — AB (ref 3–12)
Monocytes Absolute: 1.6 10*3/uL — ABNORMAL HIGH (ref 0.1–1.0)
NEUTROS PCT: 47 % (ref 43–77)
Neutro Abs: 4.7 10*3/uL (ref 1.7–7.7)
Platelets: ADEQUATE 10*3/uL (ref 150–400)
RBC: 3.76 MIL/uL — AB (ref 4.22–5.81)
RDW: 25.8 % — ABNORMAL HIGH (ref 11.5–15.5)
WBC: 10 10*3/uL (ref 4.0–10.5)
nRBC: 2 /100 WBC — ABNORMAL HIGH

## 2013-11-16 LAB — BASIC METABOLIC PANEL
ANION GAP: 12 (ref 5–15)
BUN: 7 mg/dL (ref 6–23)
CHLORIDE: 103 meq/L (ref 96–112)
CO2: 25 mEq/L (ref 19–32)
Calcium: 8.7 mg/dL (ref 8.4–10.5)
Creatinine, Ser: 0.61 mg/dL (ref 0.50–1.35)
GFR calc Af Amer: >90 mL/min (ref >90)
GFR calc non Af Amer: >90 mL/min (ref >90)
GLUCOSE: 103 mg/dL — AB (ref 70–99)
Potassium: 4.1 mEq/L (ref 3.7–5.3)
SODIUM: 140 meq/L (ref 137–147)

## 2013-11-16 LAB — RETICULOCYTES
RBC.: 3.76 MIL/uL — ABNORMAL LOW (ref 4.22–5.81)
RETIC CT PCT: 16.8 % — AB (ref 0.4–3.1)
Retic Count, Absolute: 631.7 10*3/uL — ABNORMAL HIGH (ref 19.0–186.0)

## 2013-11-16 MED ORDER — PHENYLEPHRINE 200 MCG/ML FOR PRIAPISM / HYPOTENSION
50.0000 ug | Freq: Once | INTRAMUSCULAR | Status: AC
  Administered 2013-11-16: 50 ug via INTRACAVERNOUS
  Filled 2013-11-16: qty 50

## 2013-11-16 MED ORDER — ONDANSETRON HCL 4 MG/2ML IJ SOLN
4.0000 mg | Freq: Once | INTRAMUSCULAR | Status: AC
  Administered 2013-11-16: 4 mg via INTRAVENOUS
  Filled 2013-11-16: qty 2

## 2013-11-16 MED ORDER — HYDROMORPHONE HCL 1 MG/ML IJ SOLN
1.0000 mg | Freq: Once | INTRAMUSCULAR | Status: AC
  Administered 2013-11-16: 1 mg via INTRAVENOUS
  Filled 2013-11-16: qty 1

## 2013-11-16 MED ORDER — LIDOCAINE HCL 1 % IJ SOLN
INTRAMUSCULAR | Status: AC
  Filled 2013-11-16: qty 20

## 2013-11-16 MED ORDER — SODIUM CHLORIDE 0.9 % IV BOLUS (SEPSIS)
1000.0000 mL | Freq: Once | INTRAVENOUS | Status: AC
  Administered 2013-11-16: 1000 mL via INTRAVENOUS

## 2013-11-16 NOTE — ED Notes (Signed)
Having to frequently remind patient to keep left arm straight in order to receive fluids ordered.

## 2013-11-16 NOTE — Discharge Instructions (Signed)
Priapism °Priapism is a persistent, often painful erection. It is the hardening of the penis in males and of the clitoris in females, even without sexual stimulation. Priapism may come on suddenly. Priapism may last a short while, or may last a long time. Priapism occurs in all ages. The types of priapism include: °· Acute prolonged priapism--Priapism that comes on suddenly and lasts. °· Recurrent acute priapism--Priapism that comes on suddenly and tends to happen again. °· Chronic priapism--Priapism that is persistent but with less of an erection. °CAUSES  °There are many causes. Causes include: °· Blood problems common in people with the following diseases: °¨ Sickle cell disease. °¨ Leukemia. °· Side effects of erectile dysfunction medicine. This is the most common cause of priapism. °· Side effects of prescription medicine used in the treatment of depression and anxiety. °· Illegal use of street drugs such as cocaine and marijuana. °· Excessive use of alcohol. °· Neurological problems such as multiple sclerosis. °· Diabetes mellitus. °· The cause may be unknown. °SIGNS AND SYMPTOMS °· A prolonged erection, usually without sexual stimulation or following the use of erectile dysfunction medicine. °· A painful erection. °DIAGNOSIS °Diagnosis of priapism can usually be confirmed by your health care provider after a physical exam. Your health care provider may have blood tests done to search for a potential cause, such as leukemia or sickle cell disease. °TREATMENT  °Treatments depend on the cause. Some specific treatments include: °· Oxygen and red blood cell transfusions in patients with sickle cell disease. °· A special treatment for plasma in those with leukemia. °· Removing blood that is trapped. °· Treatment with medicine. °· Surgical shunting (a passage that is made to allow blood to flow from one part of the body to another). °HOME CARE INFORMATION °· Avoid sexual stimulation and intercourse until your health  care provider says it is okay. °· Avoid the use of alcohol or drugs to minimize recurrence of priapism. °SEEK MEDICAL CARE IF: °· You experience worsening pain instead of improvement. °SEEK IMMEDIATE MEDICAL CARE IF: °· You experience fever or shaking chills. °· You experience pain, swelling, or redness in your genital or groin area. °MAKE SURE YOU: °· Understand these instructions.   °· Will watch your condition. °· Will get help right away if you are not doing well or get worse. °Document Released: 03/29/2003 Document Revised: 10/27/2012 Document Reviewed: 06/10/2012 °Exit[employer]® Patient Information ©2015 [employer]. This information is not intended to replace advice given to you by your health care provider. Make sure you discuss any questions you have with your health care provider. ° °Sickle Cell Anemia, Adult °Sickle cell anemia is a condition in which red blood cells have an abnormal "sickle" shape. This abnormal shape shortens the cells' life span, which results in a lower than normal concentration of red blood cells in the blood. The sickle shape also causes the cells to clump together and block free blood flow through the blood vessels. As a result, the tissues and organs of the body do not receive enough oxygen. Sickle cell anemia causes organ damage and pain and increases the risk of infection. °CAUSES  °Sickle cell anemia is a genetic disorder. Those who receive two copies of the gene have the condition, and those who receive one copy have the trait. °RISK FACTORS °The sickle cell gene is most common in people whose families originated in [place]. Other areas of the globe where sickle cell trait occurs include the [place], [place] and [place], the [place], and the   [place].  °SIGNS AND SYMPTOMS °· Pain, especially in the extremities, back, chest, or abdomen (common). The pain may start suddenly or may develop following an illness, especially if there is dehydration. Pain can also occur  due to overexertion or exposure to extreme temperature changes. °· Frequent severe bacterial infections, especially certain types of pneumonia and meningitis. °· Pain and swelling in the hands and feet. °· Decreased activity.   °· Loss of appetite.   °· Change in behavior. °· Headaches. °· Seizures. °· Shortness of breath or difficulty breathing. °· Vision changes. °· Skin ulcers. °Those with the trait may not have symptoms or they may have mild symptoms.  °DIAGNOSIS  °Sickle cell anemia is diagnosed with blood tests that demonstrate the genetic trait. It is often diagnosed during the newborn period, due to mandatory testing nationwide. A variety of blood tests, X-rays, CT scans, MRI scans, ultrasounds, and lung function tests may also be done to monitor the condition. °TREATMENT  °Sickle cell anemia may be treated with: °· Medicines. You may be given pain medicines, antibiotic medicines (to treat and prevent infections) or medicines to increase the production of certain types of hemoglobin. °· Fluids. °· Oxygen. °· Blood transfusions. °HOME CARE INSTRUCTIONS  °· Drink enough fluid to keep your urine clear or pale yellow. Increase your fluid intake in hot weather and during exercise. °· Do not smoke. Smoking lowers oxygen levels in the blood.   °· Only take over-the-counter or prescription medicines for pain, fever, or discomfort as directed by your health care provider. °· Take antibiotics as directed by your health care provider. Make sure you finish them it even if you start to feel better.   °· Take supplements as directed by your health care provider.   °· Consider wearing a medical alert bracelet. This tells anyone caring for you in an emergency of your condition.   °· When traveling, keep your medical information, health care provider's names, and the medicines you take with you at all times.   °· If you develop a fever, do not take medicines to reduce the fever right away. This could cover up a problem that  is developing. Notify your health care provider. °· Keep all follow-up appointments with your health care provider. Sickle cell anemia requires regular medical care. °SEEK MEDICAL CARE IF: ° You have a fever. °SEEK IMMEDIATE MEDICAL CARE IF:  °· You feel dizzy or faint.   °· You have new abdominal pain, especially on the left side near the stomach area.   °· You develop a persistent, often uncomfortable and painful penile erection (priapism). If this is not treated immediately it will lead to impotence.   °· You have numbness your arms or legs or you have a hard time moving them.   °· You have a hard time with speech.   °· You have a fever or persistent symptoms for more than 2-3 days.   °· You have a fever and your symptoms suddenly get worse.   °· You have signs or symptoms of infection. These include:   °¨ Chills.   °¨ Abnormal tiredness (lethargy).   °¨ Irritability.   °¨ Poor eating.   °¨ Vomiting.   °· You develop pain that is not helped with medicine.   °· You develop shortness of breath. °· You have pain in your chest.   °· You are coughing up pus-like or bloody sputum.   °· You develop a stiff neck. °· Your feet or hands swell or have pain. °· Your abdomen appears bloated. °· You develop joint pain. °MAKE SURE YOU: °· Understand these instructions. °Document Released: 04/16/2005   Document Revised: 05/23/2013 Document Reviewed: 08/18/2012 °ExitCare® Patient Information ©2015 [employer]. This information is not intended to replace advice given to you by your health care provider. Make sure you discuss any questions you have with your health care provider. ° °

## 2013-11-16 NOTE — ED Provider Notes (Signed)
CSN: [id number]     Arrival date & time 11/16/13  0714 History   First MD Initiated Contact with Patient 11/16/13 [phone]     Chief Complaint  Patient presents with  . Priapism   . Sickle Cell Pain Crisis     (Consider location/radiation/quality/duration/timing/severity/associated sxs/prior Treatment) HPI  Patient to the ER with complaints or sickle cell crisis and priapism around 3:00am this morning. He reports waking up with an erection every morning. Typically it resolves with Oxycodone and a hot shower. However, today that is not the case. He reports needing to have injections into his penis before as well as needing to have the urologist come and "drain his penis of blood". He expresses being in excruciating pain in his penis as well as in his back. Denies that he is having chest pain, abdominal pains, extremity pains, SOB, fevers, or weakness. VSS  Past Medical History  Diagnosis Date  . Sickle cell disease   . Acute chest syndrome(517.3)     ? in past   Past Surgical History  Procedure Laterality Date  . Appendectomy    . Gall stones    . Mini shunt insertion    . Tonsillectomy     No family history on file. History  Substance Use Topics  . Smoking status: Former Smoker    Types: Cigarettes    Quit date: 10/02/2008  . Smokeless tobacco: Never Used  . Alcohol Use: Yes     Comment: socially but none recently    Review of Systems 10 Systems reviewed and are negative for acute change except as noted in the HPI.      Allergies  Review of patient's allergies indicates no known allergies.  Home Medications   Prior to Admission medications   Medication Sig Start Date End Date Taking? Authorizing Provider  oxyCODONE (ROXICODONE) 15 MG immediate release tablet Take 1 tablet (15 mg total) by mouth every 4 (four) hours as needed for pain. 03/31/12  Yes Zannie Cove, MD   BP 120/50  Pulse 56  Temp(Src) 98.4 F (36.9 C) (Oral)  Resp 16  SpO2 94% Physical Exam   Nursing note and vitals reviewed. Constitutional: He appears well-developed and well-nourished. No distress.  HENT:  Head: Normocephalic and atraumatic.  Eyes: Pupils are equal, round, and reactive to light.  Neck: Normal range of motion. Neck supple.  Cardiovascular: Normal rate and regular rhythm.   Pulmonary/Chest: Effort normal.  Abdominal: Soft.  Genitourinary:  Firm priapism.  Neurological: He is alert.  Skin: Skin is warm and dry.    ED Course  Procedures (including critical care time) Labs Review Labs Reviewed  CBC WITH DIFFERENTIAL - Abnormal; Notable for the following:    RBC 3.76 (*)    Hemoglobin 9.0 (*)    HCT 27.6 (*)    MCV 73.4 (*)    MCH 23.9 (*)    RDW 25.8 (*)    Monocytes Relative 16 (*)    nRBC 2 (*)    Monocytes Absolute 1.6 (*)    All other components within normal limits  BASIC METABOLIC PANEL - Abnormal; Notable for the following:    Glucose, Bld 103 (*)    All other components within normal limits  RETICULOCYTES - Abnormal; Notable for the following:    Retic Ct Pct 16.8 (*)    RBC. 3.76 (*)    Retic Count, Manual 631.7 (*)    All other components within normal limits    Imaging Review No results found.  EKG Interpretation None      MDM   Final diagnoses:  Sickle cell crisis  Priapism    Medications  sodium chloride 0.9 % bolus 1,000 mL (1,000 mLs Intravenous New Bag/Given 11/16/13 1250)  lidocaine (XYLOCAINE) 1 % (with pres) injection (  Not Given 11/16/13 1248)  sodium chloride 0.9 % bolus 1,000 mL (0 mLs Intravenous Stopped 11/16/13 1248)  HYDROmorphone (DILAUDID) injection 1 mg (1 mg Intravenous Given 11/16/13 0823)  ondansetron (ZOFRAN) injection 4 mg (4 mg Intravenous Given 11/16/13 0822)  phenylephrine 200 mcg / ml CONC. DILUTION INJ (ED / Urology USE ONLY) (50 mcg Intracavernosal Given 11/16/13 0840)  HYDROmorphone (DILAUDID) injection 1 mg (1 mg Intravenous Given 11/16/13 0844)  HYDROmorphone (DILAUDID) injection 1  mg (1 mg Intravenous Given 11/16/13 0905)   9:00am Irrigate corpus cavern, priapism Performed by: Dorthula MatasGREENE, Cozetta Seif G Authorized by: Dorthula MatasGREENE, Tahjanae Blankenburg G Consent: Verbal consent obtained. written consent not obtained. The procedure was performed in an emergent situation. Risks and benefits: risks, benefits and alternatives were discussed Consent given by: patient  Patient understanding: patient states understanding of the procedure being performed  Patient consent: the patient's understanding of the procedure matches consent given  Required items: required blood products, implants, devices, and special equipment available  Patient identity confirmed: verbally with patient and arm band  Time out: Immediately prior to procedure a "time out" was called to verify the correct patient, procedure, equipment, support staff and site/side marked as required. Preparation: Patient was prepped and draped in the usual sterile fashion. Local anesthesia used: no Patient sedated: no Patient tolerance: Patient tolerated the procedure well with no immediate complications. Comments: 200mcg in 1 ml NS Phenylephrine injected onto right corpora using aseptic technique. Pressure held to injection site for 2 minutes. No immediate hematoma noted.  10:00am Patient is having some relief from the first phenylephrine injection. He is no longer having penile pain and the shaft is becoming soft. He still has an erection however. I recommended doing a second injection but he requests that I wait another hour. He is sedated from the Dilaudid but his pain is now a 3/10.  10:50 am Irrigate corpus cavern, priapism Performed by: Dorthula MatasGREENE, Corky Blumstein G Authorized by: Dorthula MatasGREENE, Cherokee Clowers G Consent: Verbal consent obtained. written consent not obtained. The procedure was performed in an emergent situation. Risks and benefits: risks, benefits and alternatives were discussed Consent given by: patient  Patient understanding: patient states  understanding of the procedure being performed  Patient consent: the patient's understanding of the procedure matches consent given  Required items: required blood products, implants, devices, and special equipment available  Patient identity confirmed: verbally with patient and arm band  Time out: Immediately prior to procedure a "time out" was called to verify the correct patient, procedure, equipment, support staff and site/side marked as required. Preparation: Patient was prepped and draped in the usual sterile fashion. Local anesthesia used: no Patient sedated: no Patient tolerance: Patient tolerated the procedure well with no immediate complications. Comments: 200mcg in 1 ml NS Phenylephrine injected onto right corpora using aseptic technique. Pressure held to injection site for 2 minutes. No immediate hematoma noted.  Pt required second injection as the first time did not adequately resolve the priapism.  12:00 pm The second injection was unsuccessful. Urology DR. MacDermoid has agreed to come in for further management.  12:45 pm NURSE INFORMED ME THAT SHE WALKED INTO ROOM AND PT HAD HIS HANDS IN HIS PANTS PLEASURING HIMSELF. PT CONTINUES TO HAVE PRIAPISM.  1:15  PM Urology came to see patient but the priapism had resolved. Urology said that the patient is good to go from the urology standpoint. The patient pain continues to be resolved from the sickle cell stand point. He has a pain management contract and is not to get narcotic rx.  33 y.o.Hakeem Armstead's evaluation in the Emergency Department is complete. It has been determined that no acute conditions requiring further emergency intervention are present at this time. The patient/guardian have been advised of the diagnosis and plan. We have discussed signs and symptoms that warrant return to the ED, such as changes or worsening in symptoms.  Vital signs are stable at discharge. Filed Vitals:   11/16/13 1133  BP: 120/50  Pulse: 56   Temp:   Resp: 16    Patient/guardian has voiced understanding and agreed to follow-up with the PCP or specialist.    Dorthula Matasiffany G Lesbia Ottaway, PA-C 11/16/13 1314  Dorthula Matasiffany G Churchill Grimsley, PA-C 11/16/13 1315

## 2013-11-16 NOTE — ED Notes (Addendum)
Writer opened patient's door and noted patient with both hands under the blanket and jerked his hands out when door opened. Patient asked for additional pain medication. Patient questioned about priapism and stated, "It's gone now."  Patient then stated, he wasn't having pain there any more. EDPA notified.

## 2013-11-16 NOTE — ED Notes (Signed)
Pt states he began having priapism at 0330.  Denies any other complaint.  Hx sickle cell.

## 2013-11-17 NOTE — ED Provider Notes (Signed)
Medical screening examination/treatment/procedure(s) were performed by non-physician practitioner and as supervising physician I was immediately available for consultation/collaboration.   EKG Interpretation None        Lora Chavers, DO 11/17/13 0743 

## 2013-11-21 ENCOUNTER — Emergency Department (HOSPITAL_COMMUNITY)
Admission: EM | Admit: 2013-11-21 | Discharge: 2013-11-21 | Disposition: A | Discharge status: Home / Self Care | Payer: Medicaid Other | Attending: Emergency Medicine | Admitting: Emergency Medicine

## 2013-11-21 ENCOUNTER — Encounter (HOSPITAL_COMMUNITY): Payer: Self-pay | Admitting: Emergency Medicine

## 2013-11-21 DIAGNOSIS — Z862 Personal history of diseases of the blood and blood-forming organs and certain disorders involving the immune mechanism: Secondary | ICD-10-CM | POA: Insufficient documentation

## 2013-11-21 DIAGNOSIS — N4830 Priapism, unspecified: Secondary | ICD-10-CM | POA: Diagnosis present

## 2013-11-21 DIAGNOSIS — Z87891 Personal history of nicotine dependence: Secondary | ICD-10-CM | POA: Diagnosis not present

## 2013-11-21 DIAGNOSIS — Z8709 Personal history of other diseases of the respiratory system: Secondary | ICD-10-CM | POA: Diagnosis not present

## 2013-11-21 LAB — BASIC METABOLIC PANEL WITH GFR
Anion gap: 11 (ref 5–15)
BUN: 8 mg/dL (ref 6–23)
CO2: 26 meq/L (ref 19–32)
Calcium: 8.7 mg/dL (ref 8.4–10.5)
Chloride: 101 meq/L (ref 96–112)
Creatinine, Ser: 0.68 mg/dL (ref 0.50–1.35)
GFR calc Af Amer: >90 mL/min
GFR calc non Af Amer: >90 mL/min
Glucose, Bld: 104 mg/dL — ABNORMAL HIGH (ref 70–99)
Potassium: 3.7 meq/L (ref 3.7–5.3)
Sodium: 138 meq/L (ref 137–147)

## 2013-11-21 LAB — CBC WITH DIFFERENTIAL/PLATELET
BASOS ABS: 0 10*3/uL (ref 0.0–0.1)
Basophils Relative: 0 % (ref 0–1)
EOS ABS: 0.4 10*3/uL (ref 0.0–0.7)
Eosinophils Relative: 3 % (ref 0–5)
HCT: 25.7 % — ABNORMAL LOW (ref 39.0–52.0)
Hemoglobin: 8.5 g/dL — ABNORMAL LOW (ref 13.0–17.0)
LYMPHS ABS: 4.8 10*3/uL — AB (ref 0.7–4.0)
Lymphocytes Relative: 33 % (ref 12–46)
MCH: 24.5 pg — ABNORMAL LOW (ref 26.0–34.0)
MCHC: 33.1 g/dL (ref 30.0–36.0)
MCV: 74.1 fL — AB (ref 78.0–100.0)
MONOS PCT: 14 % — AB (ref 3–12)
Monocytes Absolute: 2 10*3/uL — ABNORMAL HIGH (ref 0.1–1.0)
NEUTROS ABS: 7.3 10*3/uL (ref 1.7–7.7)
NEUTROS PCT: 50 % (ref 43–77)
PLATELETS: 329 10*3/uL (ref 150–400)
RBC: 3.47 MIL/uL — ABNORMAL LOW (ref 4.22–5.81)
RDW: 24 % — AB (ref 11.5–15.5)
WBC: 14.5 10*3/uL — ABNORMAL HIGH (ref 4.0–10.5)
nRBC: 1 /100 WBC — ABNORMAL HIGH

## 2013-11-21 LAB — RETICULOCYTES
RBC.: 3.47 MIL/uL — ABNORMAL LOW (ref 4.22–5.81)
Retic Count, Absolute: 607.3 K/uL — ABNORMAL HIGH (ref 19.0–186.0)
Retic Ct Pct: 17.5 % — ABNORMAL HIGH (ref 0.4–3.1)

## 2013-11-21 MED ORDER — PHENYLEPHRINE 200 MCG/ML FOR PRIAPISM / HYPOTENSION
200.0000 ug | Freq: Once | INTRAMUSCULAR | Status: AC
  Administered 2013-11-21: 200 ug via INTRACAVERNOUS
  Filled 2013-11-21: qty 50

## 2013-11-21 MED ORDER — HYDROMORPHONE HCL 2 MG/ML IJ SOLN
2.0000 mg | Freq: Once | INTRAMUSCULAR | Status: AC
  Administered 2013-11-21: 2 mg via INTRAMUSCULAR
  Filled 2013-11-21: qty 1

## 2013-11-21 NOTE — ED Provider Notes (Signed)
CSN: [id number]     Arrival date & time 11/21/13  [id number] History   First MD Initiated Contact with Patient 11/21/13 0815     Chief Complaint  Patient presents with  . priapism       (Consider location/radiation/quality/duration/timing/severity/associated sxs/prior Treatment) HPI  This is a 33 year old male with history of sickle cell anemia who presents with priapism. Patient reports onset of priapism this morning at approximately 1:30. Patient reports that he gets a daily erection but normally it will go down with oxycodone and shower. Patient reports that he did not have any improvement of his symptoms. He reports that his pain is 7 out of 10. And "my pain medication is wearing off." Patient reports that his only pain during sickle cell crisis secondary to priapism.  Past Medical History  Diagnosis Date  . Sickle cell disease   . Acute chest syndrome(517.3)     ? in past   Past Surgical History  Procedure Laterality Date  . Appendectomy    . Gall stones    . Mini shunt insertion    . Tonsillectomy     No family history on file. History  Substance Use Topics  . Smoking status: Former Smoker    Types: Cigarettes    Quit date: 10/02/2008  . Smokeless tobacco: Never Used  . Alcohol Use: Yes     Comment: socially but none recently    Review of Systems  Constitutional: Negative.  Negative for fever.  Respiratory: Negative.  Negative for chest tightness and shortness of breath.   Cardiovascular: Negative.  Negative for chest pain.  Gastrointestinal: Negative.  Negative for abdominal pain.  Genitourinary: Positive for penile swelling and penile pain. Negative for dysuria.        priapism  Musculoskeletal: Negative for back pain.  All other systems reviewed and are negative.     Allergies  Review of patient's allergies indicates no known allergies.  Home Medications   Prior to Admission medications   Medication Sig Start Date End Date Taking? Authorizing Provider   docusate sodium (COLACE) 50 MG capsule Take 50 mg by mouth daily as needed for mild constipation (constipation).   Yes Historical Provider, MD  oxyCODONE (ROXICODONE) 15 MG immediate release tablet Take 30 mg by mouth every 4 (four) hours as needed for pain (pain).   Yes Historical Provider, MD  oxyCODONE (ROXICODONE) 15 MG immediate release tablet Take 1 tablet (15 mg total) by mouth every 4 (four) hours as needed for pain. 03/31/12   Zannie CovePreetha Joseph, MD   BP 131/68 mmHg  Pulse 71  Temp(Src) 97.8 F (36.6 C) (Oral)  Resp 17  SpO2 99% Physical Exam  Constitutional: He is oriented to person, place, and time. He appears well-developed and well-nourished. No distress.  HENT:  Head: Normocephalic and atraumatic.  Cardiovascular: Normal rate, regular rhythm and normal heart sounds.   Pulmonary/Chest: Effort normal and breath sounds normal. No respiratory distress.  Genitourinary:  priapism  Musculoskeletal: He exhibits no edema.  Lymphadenopathy:    He has no cervical adenopathy.  Neurological: He is alert and oriented to person, place, and time.  Skin: Skin is warm and dry.  Psychiatric: He has a normal mood and affect.  Nursing note and vitals reviewed.   ED Course  Irrigate corpus cavern, priapism Date/Time: 11/21/2013 9:26 AM Performed by: Ross MarcusHORTON, COURTNEY, F Authorized by: Ross MarcusHORTON, COURTNEY, F Consent: Verbal consent obtained. Risks and benefits: risks, benefits and alternatives were discussed Consent given by: patient Patient understanding: patient  states understanding of the procedure being performed Patient identity confirmed: verbally with patient Time out: Immediately prior to procedure a "time out" was called to verify the correct patient, procedure, equipment, support staff and site/side marked as required. Preparation: Patient was prepped and draped in the usual sterile fashion. Local anesthesia used: no Patient sedated: no Patient tolerance: Patient tolerated the  procedure well with no immediate complications Comments: Patient refuses volume aspiration of blood.  Patient injected with 200 mcg of phenylephrine after successful aspiration of blood from corpus cavernosum   (including critical care time) Labs Review Labs Reviewed  CBC WITH DIFFERENTIAL - Abnormal; Notable for the following:    WBC 14.5 (*)    RBC 3.47 (*)    Hemoglobin 8.5 (*)    HCT 25.7 (*)    MCV 74.1 (*)    MCH 24.5 (*)    RDW 24.0 (*)    Monocytes Relative 14 (*)    nRBC 1 (*)    Lymphs Abs 4.8 (*)    Monocytes Absolute 2.0 (*)    All other components within normal limits  RETICULOCYTES - Abnormal; Notable for the following:    Retic Ct Pct 17.5 (*)    RBC. 3.47 (*)    Retic Count, Manual 607.3 (*)    All other components within normal limits  BASIC METABOLIC PANEL - Abnormal; Notable for the following:    Glucose, Bld 104 (*)    All other components within normal limits    Imaging Review No results found.   EKG Interpretation None      MDM   Final diagnoses:  Priapism    Patient presents with priapism. Reports pain consistent with his prior sickle crises. He is nontoxic on exam. Priapism present. Lab work notable for mild leukocytosis of unknown significance. Patient is afebrile. Hemoglobin at baseline. Retake count 17. Patient refused aspiration of blood but did allow for phenylephrine injection. See procedure note above.  One hour following injection, patient reports improvement of pain. Penis more flaccid but not completely. Patient declining second injection and/or aspiration. Discuss with patient that he needs to follow-up closely with urology. 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  After history, exam, and medical workup I feel the patient has been appropriately medically screened and is safe for discharge home. Pertinent diagnoses were discussed with the patient. Patient  was given return precautions.     Shon Batonourtney F Horton, MD 11/21/13 1052

## 2013-11-21 NOTE — Discharge Instructions (Signed)
It is very important for you to follow-up with your urologist.  Priapism Priapism is a persistent, often painful erection. It is the hardening of the penis in males and of the clitoris in females, even without sexual stimulation. Priapism may come on suddenly. Priapism may last a short while, or may last a long time. Priapism occurs in all ages. The types of priapism include:  Acute prolonged priapism--Priapism that comes on suddenly and lasts.  Recurrent acute priapism--Priapism that comes on suddenly and tends to happen again.  Chronic priapism--Priapism that is persistent but with less of an erection. CAUSES  There are many causes. Causes include:  Blood problems common in people with the following diseases:  Sickle cell disease.  Leukemia.  Side effects of erectile dysfunction medicine. This is the most common cause of priapism.  Side effects of prescription medicine used in the treatment of depression and anxiety.  Illegal use of street drugs such as cocaine and marijuana.  Excessive use of alcohol.  Neurological problems such as multiple sclerosis.  Diabetes mellitus.  The cause may be unknown. SIGNS AND SYMPTOMS  A prolonged erection, usually without sexual stimulation or following the use of erectile dysfunction medicine.  A painful erection. DIAGNOSIS Diagnosis of priapism can usually be confirmed by your health care provider after a physical exam. Your health care provider may have blood tests done to search for a potential cause, such as leukemia or sickle cell disease. TREATMENT  Treatments depend on the cause. Some specific treatments include:  Oxygen and red blood cell transfusions in patients with sickle cell disease.  A special treatment for plasma in those with leukemia.  Removing blood that is trapped.  Treatment with medicine.  Surgical shunting (a passage that is made to allow blood to flow from one part of the body to another). HOME CARE  INFORMATION  Avoid sexual stimulation and intercourse until your health care provider says it is okay.  Avoid the use of alcohol or drugs to minimize recurrence of priapism. SEEK MEDICAL CARE IF:  You experience worsening pain instead of improvement. SEEK IMMEDIATE MEDICAL CARE IF:  You experience fever or shaking chills.  You experience pain, swelling, or redness in your genital or groin area. MAKE SURE YOU:  Understand these instructions.   Will watch your condition.  Will get help right away if you are not doing well or get worse. Document Released: 03/29/2003 Document Revised: 10/27/2012 Document Reviewed: 06/10/2012 [facility] Patient Information 2015 [place]ExitCare, [place]. This information is not intended to replace advice given to you by your health care provider. Make sure you discuss any questions you have with your health care provider.

## 2013-11-21 NOTE — ED Notes (Signed)
Pt c/o priapism that started around 130 this morning.

## 2013-11-21 NOTE — ED Notes (Signed)
Pt reports sickle cell crisis pain with priapism that started around 1:30 this morning, pt appears very drowsy, having hard time keeping his eyes open during the assessment. Pt reports taking oxycodone for pain about 4 hours ago and reports his pain is about 4-5 right now, "but something needs to be done about priapism, oxycodone doesn't help with it."

## 2013-11-21 NOTE — ED Notes (Signed)
Pt reminded once again to change into gown.

## 2013-11-29 ENCOUNTER — Telehealth: Payer: Self-pay | Admitting: Internal Medicine

## 2013-11-29 NOTE — Telephone Encounter (Signed)
[facility] Hill [facility] to request an [facility] for patient to be seen. Patient's only appointment was 10/05/12 and the next two appointments were no shows. [facility] is advising patient of the importance of a PCP, and will continue to encourage patient to keep all appointments if/when he returns to practice.

## 2014-02-28 ENCOUNTER — Emergency Department (HOSPITAL_COMMUNITY): Payer: Medicaid Other

## 2014-02-28 ENCOUNTER — Emergency Department (HOSPITAL_COMMUNITY)
Admission: EM | Admit: 2014-02-28 | Discharge: 2014-03-01 | Disposition: A | Discharge status: Home / Self Care | Payer: Medicaid Other | Attending: Emergency Medicine | Admitting: Emergency Medicine

## 2014-02-28 ENCOUNTER — Encounter (HOSPITAL_COMMUNITY): Payer: Self-pay | Admitting: Emergency Medicine

## 2014-02-28 DIAGNOSIS — R509 Fever, unspecified: Secondary | ICD-10-CM

## 2014-02-28 DIAGNOSIS — Z79899 Other long term (current) drug therapy: Secondary | ICD-10-CM | POA: Diagnosis not present

## 2014-02-28 DIAGNOSIS — D571 Sickle-cell disease without crisis: Secondary | ICD-10-CM | POA: Diagnosis not present

## 2014-02-28 DIAGNOSIS — Z791 Long term (current) use of non-steroidal anti-inflammatories (NSAID): Secondary | ICD-10-CM | POA: Insufficient documentation

## 2014-02-28 DIAGNOSIS — Z87891 Personal history of nicotine dependence: Secondary | ICD-10-CM | POA: Diagnosis not present

## 2014-02-28 LAB — URINALYSIS, ROUTINE W REFLEX MICROSCOPIC
Glucose, UA: NEGATIVE mg/dL
Hgb urine dipstick: NEGATIVE
Ketones, ur: NEGATIVE mg/dL
Leukocytes, UA: NEGATIVE
NITRITE: NEGATIVE
PROTEIN: 30 mg/dL — AB
Specific Gravity, Urine: 1.013 (ref 1.005–1.030)
UROBILINOGEN UA: 4 mg/dL — AB (ref 0.0–1.0)
pH: 6 (ref 5.0–8.0)

## 2014-02-28 LAB — CBC WITH DIFFERENTIAL/PLATELET
BASOS ABS: 0 10*3/uL (ref 0.0–0.1)
BASOS PCT: 0 % (ref 0–1)
EOS ABS: 0 10*3/uL (ref 0.0–0.7)
Eosinophils Relative: 0 % (ref 0–5)
HCT: 29.7 % — ABNORMAL LOW (ref 39.0–52.0)
HEMOGLOBIN: 9.8 g/dL — AB (ref 13.0–17.0)
LYMPHS PCT: 9 % — AB (ref 12–46)
Lymphs Abs: 1.2 10*3/uL (ref 0.7–4.0)
MCH: 23.7 pg — ABNORMAL LOW (ref 26.0–34.0)
MCHC: 33 g/dL (ref 30.0–36.0)
MCV: 71.9 fL — ABNORMAL LOW (ref 78.0–100.0)
Monocytes Absolute: 1.2 10*3/uL — ABNORMAL HIGH (ref 0.1–1.0)
Monocytes Relative: 9 % (ref 3–12)
Neutro Abs: 11.3 10*3/uL — ABNORMAL HIGH (ref 1.7–7.7)
Neutrophils Relative %: 82 % — ABNORMAL HIGH (ref 43–77)
Platelets: 342 10*3/uL (ref 150–400)
RBC: 4.13 MIL/uL — ABNORMAL LOW (ref 4.22–5.81)
RDW: 26.2 % — ABNORMAL HIGH (ref 11.5–15.5)
WBC: 13.7 10*3/uL — ABNORMAL HIGH (ref 4.0–10.5)

## 2014-02-28 LAB — COMPREHENSIVE METABOLIC PANEL
ALK PHOS: 59 U/L (ref 39–117)
ALT: 20 U/L (ref 0–53)
ANION GAP: 10 (ref 5–15)
AST: 38 U/L — ABNORMAL HIGH (ref 0–37)
Albumin: 4.6 g/dL (ref 3.5–5.2)
BUN: 8 mg/dL (ref 6–23)
CO2: 21 mmol/L (ref 19–32)
Calcium: 8.8 mg/dL (ref 8.4–10.5)
Chloride: 109 mmol/L (ref 96–112)
Creatinine, Ser: 0.71 mg/dL (ref 0.50–1.35)
GFR calc non Af Amer: >90 mL/min (ref >90)
GLUCOSE: 107 mg/dL — AB (ref 70–99)
POTASSIUM: 3.6 mmol/L (ref 3.5–5.1)
SODIUM: 140 mmol/L (ref 135–145)
TOTAL PROTEIN: 8.5 g/dL — AB (ref 6.0–8.3)
Total Bilirubin: 8 mg/dL — ABNORMAL HIGH (ref 0.3–1.2)

## 2014-02-28 LAB — RETICULOCYTES
RBC.: 4.13 MIL/uL — ABNORMAL LOW (ref 4.22–5.81)
Retic Count, Absolute: 607.1 10*3/uL — ABNORMAL HIGH (ref 19.0–186.0)
Retic Ct Pct: 14.7 % — ABNORMAL HIGH (ref 0.4–3.1)

## 2014-02-28 LAB — URINE MICROSCOPIC-ADD ON

## 2014-02-28 LAB — I-STAT CG4 LACTIC ACID, ED: LACTIC ACID, VENOUS: 1.74 mmol/L (ref 0.5–2.0)

## 2014-02-28 MED ORDER — NAPROXEN 500 MG PO TABS: 500.0000 mg | ORAL_TABLET | Freq: Two times a day (BID) | ORAL | Status: DC

## 2014-02-28 MED ORDER — OSELTAMIVIR PHOSPHATE 75 MG PO CAPS: 75.0000 mg | ORAL_CAPSULE | Freq: Two times a day (BID) | ORAL | Status: DC

## 2014-02-28 MED ORDER — HYDROMORPHONE HCL 1 MG/ML IJ SOLN
1.0000 mg | INTRAMUSCULAR | Status: DC | PRN
  Administered 2014-02-28: 1 mg via INTRAVENOUS
  Filled 2014-02-28: qty 1

## 2014-02-28 MED ORDER — ACETAMINOPHEN 325 MG PO TABS
650.0000 mg | ORAL_TABLET | Freq: Four times a day (QID) | ORAL | Status: DC | PRN
  Administered 2014-02-28: 650 mg via ORAL
  Filled 2014-02-28: qty 2

## 2014-02-28 NOTE — ED Notes (Signed)
Pt states that he began having a HA three days ago. Pt is weak and febrile and slightly lethargic.

## 2014-02-28 NOTE — Discharge Instructions (Signed)
Fever, Adult °A fever is a higher than normal body temperature. In an adult, an oral temperature around 98.6° F (37° C) is considered normal. A temperature of 100.4° F (38° C) or higher is generally considered a fever. Mild or moderate fevers generally have no long-term effects and often do not require treatment. Extreme fever (greater than or equal to 106° F or 41.1° C) can cause seizures. The sweating that may occur with repeated or prolonged fever may cause dehydration. Elderly people can develop confusion during a fever. °A measured temperature can vary with: °· Age. °· Time of day. °· Method of measurement (mouth, underarm, rectal, or ear). °The fever is confirmed by taking a temperature with a thermometer. Temperatures can be taken different ways. Some methods are accurate and some are not. °· An oral temperature is used most commonly. Electronic thermometers are fast and accurate. °· An ear temperature will only be accurate if the thermometer is positioned as recommended by the manufacturer. °· A rectal temperature is accurate and done for those adults who have a condition where an oral temperature cannot be taken. °· An underarm (axillary) temperature is not accurate and not recommended. °Fever is a symptom, not a disease.  °CAUSES  °· Infections commonly cause fever. °· Some noninfectious causes for fever include: °· Some arthritis conditions. °· Some thyroid or adrenal gland conditions. °· Some immune system conditions. °· Some types of cancer. °· A medicine reaction. °· High doses of certain street drugs such as methamphetamine. °· Dehydration. °· Exposure to high outside or room temperatures. °· Occasionally, the source of a fever cannot be determined. This is sometimes called a "fever of unknown origin" (FUO). °· Some situations may lead to a temporary rise in body temperature that may go away on its own. Examples are: °· Childbirth. °· Surgery. °· Intense exercise. °HOME CARE INSTRUCTIONS  °· Take  appropriate medicines for fever. Follow dosing instructions carefully. If you use acetaminophen to reduce the fever, be careful to avoid taking other medicines that also contain acetaminophen. Do not take aspirin for a fever if you are younger than age 19. There is an association with Reye's syndrome. Reye's syndrome is a rare but potentially deadly disease. °· If an infection is present and antibiotics have been prescribed, take them as directed. Finish them even if you start to feel better. °· Rest as needed. °· Maintain an adequate fluid intake. To prevent dehydration during an illness with prolonged or recurrent fever, you may need to drink extra fluid. Drink enough fluids to keep your urine clear or pale yellow. °· Sponging or bathing with room temperature water may help reduce body temperature. Do not use ice water or alcohol sponge baths. °· Dress comfortably, but do not over-bundle. °SEEK MEDICAL CARE IF:  °· You are unable to keep fluids down. °· You develop vomiting or diarrhea. °· You are not feeling at least partly better after 3 days. °· You develop new symptoms or problems. °SEEK IMMEDIATE MEDICAL CARE IF:  °· You have shortness of breath or trouble breathing. °· You develop excessive weakness. °· You are dizzy or you faint. °· You are extremely thirsty or you are making little or no urine. °· You develop new pain that was not there before (such as in the head, neck, chest, back, or abdomen). °· You have persistent vomiting and diarrhea for more than 1 to 2 days. °· You develop a stiff neck or your eyes become sensitive to light. °· You develop a   skin rash.  You have a fever or persistent symptoms for more than 2 to 3 days.  You have a fever and your symptoms suddenly get worse. MAKE SURE YOU:   Understand these instructions.  Will watch your condition.  Will get help right away if you are not doing well or get worse. Document Released: 07/02/2000 Document Revised: 05/23/2013 Document  Reviewed: 11/07/2010 [facility]Exit[facility] Patient Information 2015 [place]ExitCare, [place]LLC. This information is not intended to replace advice given to you by your health care provider. Make sure you discuss any questions you have with your health care provider. Influenza Influenza ("the flu") is a viral infection of the respiratory tract. It occurs more often in winter months because people spend more time in close contact with one another. Influenza can make you feel very sick. Influenza easily spreads from person to person (contagious). CAUSES  Influenza is caused by a virus that infects the respiratory tract. You can catch the virus by breathing in droplets from an infected person's cough or sneeze. You can also catch the virus by touching something that was recently contaminated with the virus and then touching your mouth, nose, or eyes. RISKS AND COMPLICATIONS You may be at risk for a more severe case of influenza if you smoke cigarettes, have diabetes, have chronic heart disease (such as heart failure) or lung disease (such as asthma), or if you have a weakened immune system. Elderly people and pregnant women are also at risk for more serious infections. The most common problem of influenza is a lung infection (pneumonia). Sometimes, this problem can require emergency medical care and may be life threatening. SIGNS AND SYMPTOMS  Symptoms typically last 4 to 10 days and may include:  Fever.  Chills.  Headache, body aches, and muscle aches.  Sore throat.  Chest discomfort and cough.  Poor appetite.  Weakness or feeling tired.  Dizziness.  Nausea or vomiting. DIAGNOSIS  Diagnosis of influenza is often made based on your history and a physical exam. A nose or throat swab test can be done to confirm the diagnosis. TREATMENT  In mild cases, influenza goes away on its own. Treatment is directed at relieving symptoms. For more severe cases, your health care provider may prescribe antiviral medicines to  shorten the sickness. Antibiotic medicines are not effective because the infection is caused by a virus, not by bacteria. HOME CARE INSTRUCTIONS  Take medicines only as directed by your health care provider.  Use a cool mist humidifier to make breathing easier.  Get plenty of rest until your temperature returns to normal. This usually takes 3 to 4 days.  Drink enough fluid to keep your urine clear or pale yellow.  Cover yourmouth and nosewhen coughing or sneezing,and wash your handswellto prevent thevirusfrom spreading.  Stay homefromwork orschool untilthe fever is gonefor at least 491full day. PREVENTION  An annual influenza vaccination (flu shot) is the best way to avoid getting influenza. An annual flu shot is now routinely recommended for all adults in the [place] SEEK MEDICAL CARE IF:  You experiencechest pain, yourcough worsens,or you producemore mucus.  Youhave nausea,vomiting, ordiarrhea.  Your fever returns or gets worse. SEEK IMMEDIATE MEDICAL CARE IF:  You havetrouble breathing, you become short of breath,or your skin ornails becomebluish.  You have severe painor stiffnessin the neck.  You develop a sudden headache, or pain in the face or ear.  You have nausea or vomiting that you cannot control. MAKE SURE YOU:   Understand these instructions.  Will watch your  your condition. °· Will get help right away if you are not doing well or get worse. °Document Released: 01/04/2000 Document Revised: 05/23/2013 Document Reviewed: 04/07/2011 °ExitCare® Patient Information ©2015 [employer]. This information is not intended to replace advice given to you by your health care provider. Make sure you discuss any questions you have with your health care provider. ° ° °

## 2014-02-28 NOTE — ED Provider Notes (Addendum)
CSN: [id number]     Arrival date & time 02/28/14  2051 History   First MD Initiated Contact with Patient 02/28/14 2158     Chief Complaint  Patient presents with  . Headache  . Fever   HPI Patient presents to the emergency room with complaints of a headache and fever. Symptoms started a few days ago. He has noticed some cough and congestion. He denies any rhinitis or sore throat. Symptoms persisted and today started to feel worse. He also experienced some pain in his left chest and back. Prior episodes of chest syndrome. He denies any trouble with abdominal pain or dysuria. He has had some diarrhea but no vomiting. Past Medical History  Diagnosis Date  . Sickle cell disease   . Acute chest syndrome(517.3)     ? in past   Past Surgical History  Procedure Laterality Date  . Appendectomy    . Gall stones    . Mini shunt insertion    . Tonsillectomy     No family history on file. History  Substance Use Topics  . Smoking status: Former Smoker    Types: Cigarettes    Quit date: 10/02/2008  . Smokeless tobacco: Never Used  . Alcohol Use: Yes     Comment: socially but none recently    Review of Systems  All other systems reviewed and are negative.     Allergies  Review of patient's allergies indicates no known allergies.  Home Medications   Prior to Admission medications   Medication Sig Start Date End Date Taking? Authorizing Provider  docusate sodium (COLACE) 50 MG capsule Take 50 mg by mouth daily as needed for mild constipation (constipation).   Yes Historical Provider, MD  LORazepam (ATIVAN) 1 MG tablet Take 1 mg by mouth every 6 (six) hours as needed for anxiety (anxiety).   Yes Historical Provider, MD  oxyCODONE (ROXICODONE) 15 MG immediate release tablet Take 1 tablet (15 mg total) by mouth every 4 (four) hours as needed for pain. 03/31/12  Yes Zannie CovePreetha Joseph, MD  naproxen (NAPROSYN) 500 MG tablet Take 1 tablet (500 mg total) by mouth 2 (two) times daily. 02/28/14   Linwood DibblesJon  Amery Minasyan, MD  oseltamivir (TAMIFLU) 75 MG capsule Take 1 capsule (75 mg total) by mouth every 12 (twelve) hours. 02/28/14   Linwood DibblesJon Samier Jaco, MD   BP 136/64 mmHg  Pulse 86  Temp(Src) 98.6 F (37 C) (Oral)  Resp 36  Ht 6' (1.829 m)  Wt 225 lb (102.059 kg)  BMI 30.51 kg/m2  SpO2 95% Physical Exam  Constitutional: He appears well-developed and well-nourished. No distress.  HENT:  Head: Normocephalic and atraumatic.  Right Ear: External ear normal.  Left Ear: External ear normal.  Mouth/Throat: No oropharyngeal exudate.  Eyes: Conjunctivae are normal. Right eye exhibits no discharge. Left eye exhibits no discharge. No scleral icterus.  Neck: Normal range of motion and full passive range of motion without pain. Neck supple. No rigidity. No tracheal deviation and normal range of motion present.  Cardiovascular: Normal rate, regular rhythm and intact distal pulses.   Pulmonary/Chest: Effort normal and breath sounds normal. No stridor. No respiratory distress. He has no wheezes. He has no rales.  Abdominal: Soft. Bowel sounds are normal. He exhibits no distension. There is no tenderness. There is no rebound and no guarding.  Musculoskeletal: He exhibits no edema or tenderness.  Neurological: He is alert. He has normal strength. No cranial nerve deficit (no facial droop, extraocular movements intact, no slurred speech)  or sensory deficit. He exhibits normal muscle tone. He displays no seizure activity. Coordination normal.  Skin: Skin is warm and dry. No rash noted.  Psychiatric: He has a normal mood and affect.  Nursing note and vitals reviewed.   ED Course  Procedures (including critical care time) Labs Review Labs Reviewed  CBC WITH DIFFERENTIAL/PLATELET - Abnormal; Notable for the following:    WBC 13.7 (*)    RBC 4.13 (*)    Hemoglobin 9.8 (*)    HCT 29.7 (*)    MCV 71.9 (*)    MCH 23.7 (*)    RDW 26.2 (*)    Neutrophils Relative % 82 (*)    Lymphocytes Relative 9 (*)    Neutro Abs 11.3  (*)    Monocytes Absolute 1.2 (*)    All other components within normal limits  COMPREHENSIVE METABOLIC PANEL - Abnormal; Notable for the following:    Glucose, Bld 107 (*)    Total Protein 8.5 (*)    AST 38 (*)    Total Bilirubin 8.0 (*)    All other components within normal limits  URINALYSIS, ROUTINE W REFLEX MICROSCOPIC - Abnormal; Notable for the following:    Color, Urine AMBER (*)    Bilirubin Urine SMALL (*)    Protein, ur 30 (*)    Urobilinogen, UA 4.0 (*)    All other components within normal limits  RETICULOCYTES - Abnormal; Notable for the following:    Retic Ct Pct 14.7 (*)    RBC. 4.13 (*)    Retic Count, Manual 607.1 (*)    All other components within normal limits  CULTURE, BLOOD (ROUTINE X 2)  CULTURE, BLOOD (ROUTINE X 2)  URINE CULTURE  URINE MICROSCOPIC-ADD ON  INFLUENZA PANEL BY PCR (TYPE A & B, H1N1)  I-STAT CG4 LACTIC ACID, ED    Imaging Review Dg Chest Port 1 View  (if Code Sepsis Called)  02/28/2014   CLINICAL DATA:  Headache, fever, cough for 3 days ; sickle cell disease  EXAM: PORTABLE CHEST - 1 VIEW  COMPARISON:  CT 03/29/2012 and earlier studies  FINDINGS: Prominent bibasilar interstitial markings. No focal infiltrate or overt edema. Heart size upper limits normal. No effusion.  Mild thoracic dextroscoliosis as before.  IMPRESSION: 1. Prominent bibasilar interstitial markings without focal infiltrate.   Electronically Signed   By: Corlis Leak M.D.   On: 02/28/2014 21:51    Medications  acetaminophen (TYLENOL) tablet 650 mg (650 mg Oral Given 02/28/14 2158)  HYDROmorphone (DILAUDID) injection 1 mg (1 mg Intravenous Given 02/28/14 2319)     MDM   Final diagnoses:  Fever, unspecified fever cause  Sickle cell disease without crisis    Patient denies having symptoms associated with his sickle cell disease. He is not having a sickle cell crisis. He is currently not having any trouble with any chest pain or shortness of breath. Last vital signs documented  showed respiratory rate of 36 but on my exam at the bedside his respiratory rate was 18.  Patient's x-ray does not show evidence of pneumonia. I doubt acute chest syndrome.  I suspect his symptoms may be related to a viral illness, possibly influenza. Neck is supple.  Doubt meningitis.  He did not get his flu vaccination this year.  Dc home  With naprosyn and tamiflu.  Follow up with PCP.  Return for worsening symptoms.    Linwood Dibbles, MD 03/01/14 Marlyne Beards  Linwood Dibbles, MD 03/01/14 Marlyne Beards

## 2014-03-01 ENCOUNTER — Emergency Department (HOSPITAL_COMMUNITY)
Admission: EM | Admit: 2014-03-01 | Discharge: 2014-03-01 | Disposition: A | Discharge status: Home / Self Care | Payer: Medicaid Other | Source: Home / Self Care | Attending: Emergency Medicine | Admitting: Emergency Medicine

## 2014-03-01 ENCOUNTER — Encounter (HOSPITAL_COMMUNITY): Payer: Self-pay | Admitting: Emergency Medicine

## 2014-03-01 DIAGNOSIS — N4830 Priapism, unspecified: Secondary | ICD-10-CM

## 2014-03-01 LAB — RETICULOCYTES
RBC.: 3.86 MIL/uL — AB (ref 4.22–5.81)
RETIC COUNT ABSOLUTE: 563.6 10*3/uL — AB (ref 19.0–186.0)
RETIC CT PCT: 14.6 % — AB (ref 0.4–3.1)

## 2014-03-01 LAB — CBC WITH DIFFERENTIAL/PLATELET
Basophils Absolute: 0.1 10*3/uL (ref 0.0–0.1)
Basophils Relative: 1 % (ref 0–1)
EOS ABS: 0.1 10*3/uL (ref 0.0–0.7)
Eosinophils Relative: 1 % (ref 0–5)
HEMATOCRIT: 27.6 % — AB (ref 39.0–52.0)
Hemoglobin: 9.1 g/dL — ABNORMAL LOW (ref 13.0–17.0)
Lymphocytes Relative: 21 % (ref 12–46)
Lymphs Abs: 2.1 10*3/uL (ref 0.7–4.0)
MCH: 23.5 pg — ABNORMAL LOW (ref 26.0–34.0)
MCHC: 33 g/dL (ref 30.0–36.0)
MCV: 71.3 fL — ABNORMAL LOW (ref 78.0–100.0)
Monocytes Absolute: 2 10*3/uL — ABNORMAL HIGH (ref 0.1–1.0)
Monocytes Relative: 20 % — ABNORMAL HIGH (ref 3–12)
NEUTROS ABS: 5.7 10*3/uL (ref 1.7–7.7)
NRBC: 2 /100{WBCs} — AB
Neutrophils Relative %: 57 % (ref 43–77)
Platelets: 311 10*3/uL (ref 150–400)
RBC: 3.87 MIL/uL — ABNORMAL LOW (ref 4.22–5.81)
RDW: 26.1 % — ABNORMAL HIGH (ref 11.5–15.5)
WBC: 10 10*3/uL (ref 4.0–10.5)

## 2014-03-01 LAB — INFLUENZA PANEL BY PCR (TYPE A & B)
H1N1FLUPCR: NOT DETECTED
INFLBPCR: NEGATIVE
Influenza A By PCR: NEGATIVE

## 2014-03-01 LAB — BASIC METABOLIC PANEL
Anion gap: 5 (ref 5–15)
BUN: 7 mg/dL (ref 6–23)
CO2: 27 mmol/L (ref 19–32)
CREATININE: 0.58 mg/dL (ref 0.50–1.35)
Calcium: 8 mg/dL — ABNORMAL LOW (ref 8.4–10.5)
Chloride: 104 mmol/L (ref 96–112)
GFR calc Af Amer: >90 mL/min (ref >90)
GFR calc non Af Amer: >90 mL/min (ref >90)
GLUCOSE: 93 mg/dL (ref 70–99)
Potassium: 3.3 mmol/L — ABNORMAL LOW (ref 3.5–5.1)
SODIUM: 136 mmol/L (ref 135–145)

## 2014-03-01 LAB — URINE CULTURE
Colony Count: NO GROWTH
Culture: NO GROWTH

## 2014-03-01 MED ORDER — DIPHENHYDRAMINE HCL 50 MG/ML IJ SOLN
25.0000 mg | Freq: Once | INTRAMUSCULAR | Status: AC
  Administered 2014-03-01: 25 mg via INTRAVENOUS
  Filled 2014-03-01: qty 1

## 2014-03-01 MED ORDER — SODIUM CHLORIDE 0.9 % IV BOLUS (SEPSIS)
1000.0000 mL | Freq: Once | INTRAVENOUS | Status: AC
  Administered 2014-03-01: 1000 mL via INTRAVENOUS

## 2014-03-01 MED ORDER — LIDOCAINE HCL (PF) 1 % IJ SOLN
5.0000 mL | Freq: Once | INTRAMUSCULAR | Status: AC
Start: 2014-03-01 — End: 2014-03-01
  Administered 2014-03-01: 5 mL via INTRADERMAL
  Filled 2014-03-01: qty 5

## 2014-03-01 MED ORDER — PHENYLEPHRINE 200 MCG/ML FOR PRIAPISM / HYPOTENSION
500.0000 ug | INTRAMUSCULAR | Status: DC | PRN
  Filled 2014-03-01: qty 50

## 2014-03-01 MED ORDER — HYDROMORPHONE HCL 1 MG/ML IJ SOLN
1.0000 mg | Freq: Once | INTRAMUSCULAR | Status: AC
  Administered 2014-03-01: 1 mg via INTRAVENOUS
  Filled 2014-03-01: qty 1

## 2014-03-01 MED ORDER — SODIUM CHLORIDE 0.9 % IV BOLUS (SEPSIS)
1000.0000 mL | Freq: Once | INTRAVENOUS | Status: AC
Start: 2014-03-01 — End: 2014-03-01
  Administered 2014-03-01: 1000 mL via INTRAVENOUS

## 2014-03-01 NOTE — Discharge Instructions (Signed)
Priapism °Priapism is a persistent, often painful erection. It is the hardening of the penis in males and of the clitoris in females, even without sexual stimulation. Priapism may come on suddenly. Priapism may last a short while, or may last a long time. Priapism occurs in all ages. The types of priapism include: °· Acute prolonged priapism--Priapism that comes on suddenly and lasts. °· Recurrent acute priapism--Priapism that comes on suddenly and tends to happen again. °· Chronic priapism--Priapism that is persistent but with less of an erection. °CAUSES  °There are many causes. Causes include: °· Blood problems common in people with the following diseases: °¨ Sickle cell disease. °¨ Leukemia. °· Side effects of erectile dysfunction medicine. This is the most common cause of priapism. °· Side effects of prescription medicine used in the treatment of depression and anxiety. °· Illegal use of street drugs such as cocaine and marijuana. °· Excessive use of alcohol. °· Neurological problems such as multiple sclerosis. °· Diabetes mellitus. °· The cause may be unknown. °SIGNS AND SYMPTOMS °· A prolonged erection, usually without sexual stimulation or following the use of erectile dysfunction medicine. °· A painful erection. °DIAGNOSIS °Diagnosis of priapism can usually be confirmed by your health care provider after a physical exam. Your health care provider may have blood tests done to search for a potential cause, such as leukemia or sickle cell disease. °TREATMENT  °Treatments depend on the cause. Some specific treatments include: °· Oxygen and red blood cell transfusions in patients with sickle cell disease. °· A special treatment for plasma in those with leukemia. °· Removing blood that is trapped. °· Treatment with medicine. °· Surgical shunting (a passage that is made to allow blood to flow from one part of the body to another). °HOME CARE INFORMATION °· Avoid sexual stimulation and intercourse until your health  care provider says it is okay. °· Avoid the use of alcohol or drugs to minimize recurrence of priapism. °SEEK MEDICAL CARE IF: °· You experience worsening pain instead of improvement. °SEEK IMMEDIATE MEDICAL CARE IF: °· You experience fever or shaking chills. °· You experience pain, swelling, or redness in your genital or groin area. °MAKE SURE YOU: °· Understand these instructions.   °· Will watch your condition. °· Will get help right away if you are not doing well or get worse. °Document Released: 03/29/2003 Document Revised: 10/27/2012 Document Reviewed: 06/10/2012 °ExitCare® Patient Information ©2015 [employer], [employer]. This information is not intended to replace advice given to you by your health care provider. Make sure you discuss any questions you have with your health care provider. ° °

## 2014-03-01 NOTE — ED Provider Notes (Signed)
CSN: [id number]     Arrival date & time 03/01/14  1536 History   First MD Initiated Contact with Patient 03/01/14 1644     Chief Complaint  Patient presents with  . priaprism      (Consider location/radiation/quality/duration/timing/severity/associated sxs/prior Treatment) HPI   This 34 year old male with a past medical history of sickle cell disease who presents to the emergency department with complaint of priapism. The patient's priapism started at 7 AM this morning and has been constant for the past 9 hours. He complains of pain. He denies any changes in the color of his skin. He states he has had this previously and he has had prior illicit presents for both short and longer amounts of time. Patient previously [place]   Past Medical History  Diagnosis Date  . Sickle cell disease   . Acute chest syndrome(517.3)     ? in past   Past Surgical History  Procedure Laterality Date  . Appendectomy    . Gall stones    . Mini shunt insertion    . Tonsillectomy     No family history on file. History  Substance Use Topics  . Smoking status: Former Smoker    Types: Cigarettes    Quit date: 10/02/2008  . Smokeless tobacco: Never Used  . Alcohol Use: Yes     Comment: socially but none recently    Review of Systems  Constitutional: Negative for fever and chills.  Respiratory: Negative for cough and shortness of breath.   Cardiovascular: Negative for chest pain and palpitations.  Gastrointestinal: Negative for vomiting, abdominal pain, diarrhea and constipation.  Genitourinary: Positive for penile pain. Negative for dysuria, urgency and frequency.  Musculoskeletal: Negative for myalgias and arthralgias.  Skin: Negative for rash.  Neurological: Negative for headaches.      Allergies  Review of patient's allergies indicates no known allergies.  Home Medications   Prior to Admission medications   Medication Sig Start Date End Date Taking?  Authorizing Provider  acetaminophen (TYLENOL) 500 MG tablet Take 1,000 mg by mouth every 6 (six) hours as needed for moderate pain (pain).   Yes Historical Provider, MD  naproxen (NAPROSYN) 500 MG tablet Take 1 tablet (500 mg total) by mouth 2 (two) times daily. 02/28/14  Yes Linwood Dibbles, MD  oseltamivir (TAMIFLU) 75 MG capsule Take 1 capsule (75 mg total) by mouth every 12 (twelve) hours. 02/28/14  Yes Linwood Dibbles, MD  oxyCODONE (ROXICODONE) 15 MG immediate release tablet Take 1 tablet (15 mg total) by mouth every 4 (four) hours as needed for pain. 03/31/12  Yes Zannie Cove, MD  docusate sodium (COLACE) 50 MG capsule Take 50 mg by mouth daily as needed for mild constipation (constipation).    Historical Provider, MD  LORazepam (ATIVAN) 1 MG tablet Take 1 mg by mouth every 6 (six) hours as needed for anxiety (anxiety).    Historical Provider, MD   BP 129/75 mmHg  Pulse 77  Temp(Src) 98 F (36.7 C) (Oral)  Resp 16  SpO2 99% Physical Exam  Constitutional: He appears well-developed and well-nourished. No distress.  HENT:  Head: Normocephalic and atraumatic.  Eyes: Conjunctivae are normal. No scleral icterus.  Neck: Normal range of motion. Neck supple.  Cardiovascular: Normal rate, regular rhythm and normal heart sounds.   Pulmonary/Chest: Effort normal and breath sounds normal. No respiratory distress.  Abdominal: Soft. There is no tenderness.  Genitourinary:    Circumcised. Penile tenderness present.  Musculoskeletal:  He exhibits no edema.  Neurological: He is alert.  Skin: Skin is warm and dry. He is not diaphoretic.  Psychiatric: His behavior is normal.  Nursing note and vitals reviewed.   ED Course  Procedures (including critical care time) Labs Review Labs Reviewed  BASIC METABOLIC PANEL - Abnormal; Notable for the following:    Potassium 3.3 (*)    Calcium 8.0 (*)    All other components within normal limits  CBC WITH DIFFERENTIAL/PLATELET - Abnormal; Notable for the following:     RBC 3.87 (*)    Hemoglobin 9.1 (*)    HCT 27.6 (*)    MCV 71.3 (*)    MCH 23.5 (*)    RDW 26.1 (*)    Monocytes Relative 20 (*)    nRBC 2 (*)    Monocytes Absolute 2.0 (*)    All other components within normal limits  RETICULOCYTES - Abnormal; Notable for the following:    Retic Ct Pct 14.6 (*)    RBC. 3.86 (*)    Retic Count, Manual 563.6 (*)    All other components within normal limits    Imaging Review No results found.   EKG Interpretation None      PRIAPISM TREATMENT (injection) Patient was prepped and draped in standard sterile fashion Just prior to procedure a timeout was performed Left corpus cavernosum was entered with a 25-gauge syringe Blood was aspirated 2.5 cc of 200 mcg/ml phenylephrine (500mcg) was injected The patient tolerated the procedure    MDM   Final diagnoses:  Priapism    6:44 AM BP 129/75 mmHg  Pulse 77  Temp(Src) 98 F (36.7 C) (Oral)  Resp 16  SpO2 99%  Patient wihth mild hypokalemia Patient received 500 mcg of phenylephrerine with some improvement.  6:44 AM BP 129/75 mmHg  Pulse 77  Temp(Src) 98 F (36.7 C) (Oral)  Resp 16  SpO2 99% Patient seen in shared visit with Dr. Silverio LayYao He is refusing aspiration or repeat injections because he states " I know my body." We will reevaluate   8:00 PM  BP 129/75 mmHg  Pulse 77  Temp(Src) 98 F (36.7 C) (Oral)  Resp 16  SpO2 99% Patient with improved erection. He appears safe for discharge at this time . Patient seen in shared visit with attending physician. .Pt feels improved after observation and/or treatment in ED.    Arthor Captainbigail Babe Clenney, PA-C 03/03/14 [id number]  Richardean Canalavid H Yao, MD 03/03/14 [id number]

## 2014-03-01 NOTE — ED Notes (Signed)
Pt c/o priapism that has been going on for 8 hours.

## 2014-03-01 NOTE — ED Notes (Signed)
Pt asked for something to eat, pt made aware that he cannot eat right now just in case he has to go to the OR.

## 2014-03-07 LAB — CULTURE, BLOOD (ROUTINE X 2)
Culture: NO GROWTH
Culture: NO GROWTH

## 2014-08-31 ENCOUNTER — Emergency Department (HOSPITAL_COMMUNITY)
Admission: EM | Admit: 2014-08-31 | Discharge: 2014-08-31 | Disposition: A | Discharge status: Home / Self Care | Payer: Medicaid Other | Attending: Emergency Medicine | Admitting: Emergency Medicine

## 2014-08-31 ENCOUNTER — Encounter (HOSPITAL_COMMUNITY): Payer: Self-pay

## 2014-08-31 DIAGNOSIS — N4889 Other specified disorders of penis: Secondary | ICD-10-CM | POA: Diagnosis present

## 2014-08-31 DIAGNOSIS — N4830 Priapism, unspecified: Secondary | ICD-10-CM

## 2014-08-31 DIAGNOSIS — Z87891 Personal history of nicotine dependence: Secondary | ICD-10-CM | POA: Diagnosis not present

## 2014-08-31 DIAGNOSIS — D571 Sickle-cell disease without crisis: Secondary | ICD-10-CM | POA: Diagnosis not present

## 2014-08-31 LAB — CBC WITH DIFFERENTIAL/PLATELET
BASOS ABS: 0.1 10*3/uL (ref 0.0–0.1)
Basophils Relative: 1 % (ref 0–1)
Eosinophils Absolute: 0.4 10*3/uL (ref 0.0–0.7)
Eosinophils Relative: 3 % (ref 0–5)
HCT: 26.9 % — ABNORMAL LOW (ref 39.0–52.0)
Hemoglobin: 8.3 g/dL — ABNORMAL LOW (ref 13.0–17.0)
LYMPHS ABS: 5 10*3/uL — AB (ref 0.7–4.0)
Lymphocytes Relative: 38 % (ref 12–46)
MCH: 21.4 pg — AB (ref 26.0–34.0)
MCHC: 30.9 g/dL (ref 30.0–36.0)
MCV: 69.3 fL — AB (ref 78.0–100.0)
MONO ABS: 2.1 10*3/uL — AB (ref 0.1–1.0)
Monocytes Relative: 16 % — ABNORMAL HIGH (ref 3–12)
Neutro Abs: 5.5 10*3/uL (ref 1.7–7.7)
Neutrophils Relative %: 42 % — ABNORMAL LOW (ref 43–77)
Platelets: 321 10*3/uL (ref 150–400)
RBC: 3.88 MIL/uL — ABNORMAL LOW (ref 4.22–5.81)
RDW: 25.2 % — AB (ref 11.5–15.5)
WBC: 13.1 10*3/uL — ABNORMAL HIGH (ref 4.0–10.5)

## 2014-08-31 LAB — RETICULOCYTES
RBC.: 3.88 MIL/uL — AB (ref 4.22–5.81)
Retic Count, Absolute: 519.9 10*3/uL — ABNORMAL HIGH (ref 19.0–186.0)
Retic Ct Pct: 13.4 % — ABNORMAL HIGH (ref 0.4–3.1)

## 2014-08-31 LAB — BASIC METABOLIC PANEL
Anion gap: 7 (ref 5–15)
BUN: 8 mg/dL (ref 6–20)
CO2: 26 mmol/L (ref 22–32)
Calcium: 8.7 mg/dL — ABNORMAL LOW (ref 8.9–10.3)
Chloride: 106 mmol/L (ref 101–111)
Creatinine, Ser: 0.66 mg/dL (ref 0.61–1.24)
GFR calc Af Amer: >60 mL/min (ref >60)
Glucose, Bld: 103 mg/dL — ABNORMAL HIGH (ref 65–99)
Potassium: 3.6 mmol/L (ref 3.5–5.1)
Sodium: 139 mmol/L (ref 135–145)

## 2014-08-31 MED ORDER — LIDOCAINE-EPINEPHRINE (PF) 2 %-1:200000 IJ SOLN
10.0000 mL | Freq: Once | INTRAMUSCULAR | Status: DC
  Filled 2014-08-31: qty 10

## 2014-08-31 MED ORDER — HYDROMORPHONE HCL 1 MG/ML IJ SOLN
1.0000 mg | Freq: Once | INTRAMUSCULAR | Status: AC
Start: 2014-08-31 — End: 2014-08-31
  Administered 2014-08-31: 1 mg via INTRAVENOUS
  Filled 2014-08-31: qty 1

## 2014-08-31 MED ORDER — HYDROMORPHONE HCL 2 MG/ML IJ SOLN
2.0000 mg | Freq: Once | INTRAMUSCULAR | Status: AC
  Administered 2014-08-31: 2 mg via INTRAVENOUS
  Filled 2014-08-31: qty 1

## 2014-08-31 MED ORDER — PHENYLEPHRINE 200 MCG/ML FOR PRIAPISM / HYPOTENSION
500.0000 ug | Freq: Once | INTRAMUSCULAR | Status: DC
  Filled 2014-08-31: qty 50

## 2014-08-31 NOTE — ED Provider Notes (Signed)
CSN: [id number]     Arrival date & time 08/31/14  1328 History   First MD Initiated Contact with Patient 08/31/14 1336     Chief Complaint  Patient presents with  . Priapism      (Consider location/radiation/quality/duration/timing/severity/associated sxs/prior Treatment) The history is provided by the patient. No language interpreter was used.  Mr Kozlov is a 34 year old malewith a history of sickle cell and acute chest syndrome who presents with priapism that began at 7am today. Pain is 9/10. He states he took 2 hydromorphone and waited 1.5 hours and took 15mg  oxycodone without any relief. States he has had this in the past. He denies any fever, chills, chest pain, shortness of breath, or abdominal pain.   Past Medical History  Diagnosis Date  . Sickle cell disease   . Acute chest syndrome(517.3)     ? in past   Past Surgical History  Procedure Laterality Date  . Appendectomy    . Gall stones    . Mini shunt insertion    . Tonsillectomy     History reviewed. No pertinent family history. Social History  Substance Use Topics  . Smoking status: Former Smoker    Types: Cigarettes    Quit date: 10/02/2008  . Smokeless tobacco: Never Used  . Alcohol Use: Yes     Comment: socially but none recently    Review of Systems  Constitutional: Negative for fever and chills.  Genitourinary: Positive for penile pain.  Neurological: Negative for headaches.  All other systems reviewed and are negative.     Allergies  Morphine and related  Home Medications   Prior to Admission medications   Medication Sig Start Date End Date Taking? Authorizing Provider  acetaminophen (TYLENOL) 500 MG tablet Take 1,000 mg by mouth every 6 (six) hours as needed for moderate pain (pain).   Yes Historical Provider, MD  LORazepam (ATIVAN) 1 MG tablet Take 1 mg by mouth every 6 (six) hours as needed for anxiety (anxiety).   Yes Historical Provider, MD  oxyCODONE (ROXICODONE) 15 MG immediate release  tablet Take 15 mg by mouth 4 (four) times daily as needed for pain.  06/05/14  Yes Historical Provider, MD   BP 123/59 mmHg  Pulse 76  Temp(Src) 98.2 F (36.8 C) (Oral)  Resp 16  SpO2 98% Physical Exam  Constitutional: He is oriented to person, place, and time. He appears well-developed and well-nourished.  HENT:  Head: Normocephalic and atraumatic.  Eyes: Conjunctivae are normal.  Neck: Normal range of motion. Neck supple.  Cardiovascular: Normal rate, regular rhythm and normal heart sounds.   Pulmonary/Chest: Effort normal and breath sounds normal. No respiratory distress. He has no wheezes.  Abdominal: Soft. He exhibits no distension. There is no tenderness.  Musculoskeletal: Normal range of motion.  Neurological: He is alert and oriented to person, place, and time.  Skin: Skin is warm and dry.  Psychiatric: He has a normal mood and affect. His behavior is normal.  Nursing note and vitals reviewed.   ED Course  Procedures (including critical care time)   Labs Review Labs Reviewed  CBC WITH DIFFERENTIAL/PLATELET - Abnormal; Notable for the following:    WBC 13.1 (*)    RBC 3.88 (*)    Hemoglobin 8.3 (*)    HCT 26.9 (*)    MCV 69.3 (*)    MCH 21.4 (*)    RDW 25.2 (*)    Neutrophils Relative % 42 (*)    Monocytes Relative 16 (*)  Lymphs Abs 5.0 (*)    Monocytes Absolute 2.1 (*)    All other components within normal limits  BASIC METABOLIC PANEL - Abnormal; Notable for the following:    Glucose, Bld 103 (*)    Calcium 8.7 (*)    All other components within normal limits  RETICULOCYTES - Abnormal; Notable for the following:    Retic Ct Pct 13.4 (*)    RBC. 3.88 (*)    Retic Count, Manual 519.9 (*)    All other components within normal limits    Imaging Review No results found.   EKG Interpretation None      MDM   Final diagnoses:  Priapism  Sickle cell disease without crisis   Patient presents for priapism. After 2 doses of Dilaudid, patient states  he feels that his priapism has gone down and refused phenylepherine injection. He is in no acute distress at this time. Vitals are stable. Reticulocyte count and labs are comparable to previous labs. He has no other complaints. I signed this patient out to Fayrene Helper, PA-C. I discussed that the patient may need an injection and that he would decide after 30 minutes.     Catha Gosselin, PA-C 09/02/14 1610  Lavera Guise, MD 09/04/14 [phone]

## 2014-08-31 NOTE — ED Notes (Signed)
Pt c/o priapism starting around 0700.  Pains score 9/10.  Hx of Sickle Cell.  Pt reports frequent episodes of same.

## 2014-08-31 NOTE — Discharge Instructions (Signed)
Priapism Follow up with sickle cell clinic. Priapism is a persistent, often painful erection. It is the hardening of the penis in males and of the clitoris in females, even without sexual stimulation. Priapism may come on suddenly. Priapism may last a short while, or may last a long time. Priapism occurs in all ages. The types of priapism include:  Acute prolonged priapism--Priapism that comes on suddenly and lasts.  Recurrent acute priapism--Priapism that comes on suddenly and tends to happen again.  Chronic priapism--Priapism that is persistent but with less of an erection. CAUSES  There are many causes. Causes include:  Blood problems common in people with the following diseases:  Sickle cell disease.  Leukemia.  Side effects of erectile dysfunction medicine. This is the most common cause of priapism.  Side effects of prescription medicine used in the treatment of depression and anxiety.  Illegal use of street drugs such as cocaine and marijuana.  Excessive use of alcohol.  Neurological problems such as multiple sclerosis.  Diabetes mellitus.  The cause may be unknown. SIGNS AND SYMPTOMS  A prolonged erection, usually without sexual stimulation or following the use of erectile dysfunction medicine.  A painful erection. DIAGNOSIS Diagnosis of priapism can usually be confirmed by your health care provider after a physical exam. Your health care provider may have blood tests done to search for a potential cause, such as leukemia or sickle cell disease. TREATMENT  Treatments depend on the cause. Some specific treatments include:  Oxygen and red blood cell transfusions in patients with sickle cell disease.  A special treatment for plasma in those with leukemia.  Removing blood that is trapped.  Treatment with medicine.  Surgical shunting (a passage that is made to allow blood to flow from one part of the body to another). HOME CARE INFORMATION  Avoid sexual  stimulation and intercourse until your health care provider says it is okay.  Avoid the use of alcohol or drugs to minimize recurrence of priapism. SEEK MEDICAL CARE IF:  You experience worsening pain instead of improvement. SEEK IMMEDIATE MEDICAL CARE IF:  You experience fever or shaking chills.  You experience pain, swelling, or redness in your genital or groin area. MAKE SURE YOU:  Understand these instructions.   Will watch your condition.  Will get help right away if you are not doing well or get worse. Document Released: 03/29/2003 Document Revised: 10/27/2012 Document Reviewed: 06/10/2012 [facility] Patient Information 2015 [place], [place]. This information is not intended to replace advice given to you by your health care provider. Make sure you discuss any questions you have with your health care provider.

## 2014-08-31 NOTE — ED Provider Notes (Signed)
  Physical Exam  BP 123/59 mmHg  Pulse 72  Temp(Src) 98.2 F (36.8 C) (Oral)  Resp 16  SpO2 92%  Physical Exam  Constitutional: He appears well-developed and well-nourished. No distress.  HENT:  Head: Atraumatic.  Eyes: Conjunctivae are normal.  Neck: Neck supple.  Genitourinary:  Chaperone present:  Penis is moderately but not fully detumesce.  nontender on palpation.  Neurological: He is alert.  Skin: No rash noted.  Psychiatric: He has a normal mood and affect.  Nursing note and vitals reviewed.   ED Course  Procedures  MDM Pt was sign out to me at shift change.  He has hx of sickle cell disease and recurrent priapism.  Has priapism since 7am.  Pt refused phenylephrine injection.  He did achieve moderate detumescence when i evaluate him but not full detumescence.  He however report feeling much better and request to be discharge.  He agrees to f/u with urology and with his sickle cell clinic as scheduled.  Return precaution discussed.       Fayrene Helper, PA-C 08/31/14 1655  Elwin Mocha, MD 09/01/14 [phone]

## 2015-01-06 ENCOUNTER — Emergency Department (HOSPITAL_COMMUNITY)
Admission: EM | Admit: 2015-01-06 | Discharge: 2015-01-07 | Disposition: A | Discharge status: Home / Self Care | Payer: Medicaid Other | Attending: Emergency Medicine | Admitting: Emergency Medicine

## 2015-01-06 ENCOUNTER — Emergency Department (HOSPITAL_COMMUNITY): Payer: Medicaid Other

## 2015-01-06 ENCOUNTER — Encounter (HOSPITAL_COMMUNITY): Payer: Self-pay | Admitting: Nurse Practitioner

## 2015-01-06 DIAGNOSIS — R079 Chest pain, unspecified: Secondary | ICD-10-CM | POA: Diagnosis present

## 2015-01-06 DIAGNOSIS — Z862 Personal history of diseases of the blood and blood-forming organs and certain disorders involving the immune mechanism: Secondary | ICD-10-CM | POA: Diagnosis not present

## 2015-01-06 DIAGNOSIS — Z87891 Personal history of nicotine dependence: Secondary | ICD-10-CM | POA: Insufficient documentation

## 2015-01-06 DIAGNOSIS — Z79899 Other long term (current) drug therapy: Secondary | ICD-10-CM | POA: Insufficient documentation

## 2015-01-06 LAB — COMPREHENSIVE METABOLIC PANEL
ALBUMIN: 4.5 g/dL (ref 3.5–5.0)
ALT: 32 U/L (ref 17–63)
ANION GAP: 7 (ref 5–15)
AST: 52 U/L — ABNORMAL HIGH (ref 15–41)
Alkaline Phosphatase: 55 U/L (ref 38–126)
BUN: 8 mg/dL (ref 6–20)
CALCIUM: 9 mg/dL (ref 8.9–10.3)
CO2: 24 mmol/L (ref 22–32)
Chloride: 111 mmol/L (ref 101–111)
Creatinine, Ser: 0.64 mg/dL (ref 0.61–1.24)
GFR calc Af Amer: >60 mL/min (ref >60)
GFR calc non Af Amer: >60 mL/min (ref >60)
GLUCOSE: 92 mg/dL (ref 65–99)
POTASSIUM: 3.9 mmol/L (ref 3.5–5.1)
SODIUM: 142 mmol/L (ref 135–145)
TOTAL PROTEIN: 8.1 g/dL (ref 6.5–8.1)
Total Bilirubin: 4.1 mg/dL — ABNORMAL HIGH (ref 0.3–1.2)

## 2015-01-06 LAB — CBC WITH DIFFERENTIAL/PLATELET
BASOS ABS: 0.2 10*3/uL — AB (ref 0.0–0.1)
Basophils Relative: 1 %
Eosinophils Absolute: 0.4 10*3/uL (ref 0.0–0.7)
Eosinophils Relative: 2 %
HCT: 26.7 % — ABNORMAL LOW (ref 39.0–52.0)
Hemoglobin: 9 g/dL — ABNORMAL LOW (ref 13.0–17.0)
LYMPHS PCT: 33 %
Lymphs Abs: 5.9 10*3/uL — ABNORMAL HIGH (ref 0.7–4.0)
MCH: 24.1 pg — AB (ref 26.0–34.0)
MCHC: 33.7 g/dL (ref 30.0–36.0)
MCV: 71.6 fL — ABNORMAL LOW (ref 78.0–100.0)
MONOS PCT: 13 %
Monocytes Absolute: 2.3 10*3/uL — ABNORMAL HIGH (ref 0.1–1.0)
NEUTROS ABS: 9.2 10*3/uL — AB (ref 1.7–7.7)
NEUTROS PCT: 51 %
PLATELETS: 247 10*3/uL (ref 150–400)
RBC: 3.73 MIL/uL — ABNORMAL LOW (ref 4.22–5.81)
RDW: 28 % — ABNORMAL HIGH (ref 11.5–15.5)
WBC MORPHOLOGY: INCREASED
WBC: 18 10*3/uL — ABNORMAL HIGH (ref 4.0–10.5)

## 2015-01-06 LAB — I-STAT TROPONIN, ED: TROPONIN I, POC: 0 ng/mL (ref 0.00–0.08)

## 2015-01-06 MED ORDER — KETOROLAC TROMETHAMINE 30 MG/ML IJ SOLN
30.0000 mg | Freq: Once | INTRAMUSCULAR | Status: AC
  Administered 2015-01-06: 30 mg via INTRAVENOUS
  Filled 2015-01-06: qty 1

## 2015-01-06 MED ORDER — SODIUM CHLORIDE 0.9 % IV BOLUS (SEPSIS)
1000.0000 mL | Freq: Once | INTRAVENOUS | Status: AC
  Administered 2015-01-06: 1000 mL via INTRAVENOUS

## 2015-01-06 MED ORDER — HYDROMORPHONE HCL 1 MG/ML IJ SOLN
1.0000 mg | Freq: Once | INTRAMUSCULAR | Status: AC
  Administered 2015-01-06: 1 mg via INTRAVENOUS
  Filled 2015-01-06: qty 1

## 2015-01-06 NOTE — ED Provider Notes (Signed)
By signing my name below, I, Kerry Hill, attest that this documentation has been prepared under the direction and in the presence of Nelson Julson N Donoven Pett, DO. Electronically Signed: Doreatha MartinEva Hill, ED Scribe. 01/06/2015. 11:42 PM.   TIME SEEN: 11:32 PM   CHIEF COMPLAINT:  Chief Complaint  Patient presents with  . Chest Pain    HPI:  HPI Comments: Kerry Hill is a 34 y.o. male with h/o sickle cell disease, acute chest syndrome who presents to the Emergency Department complaining of moderate, waxing and waning but constant, localized, left-sided, pulsing CP onset this morning at 11:30. He states that when the pain began, it was bilateral and a few hours ago, the right-sided pain subsided. He states that pain is worsened with deep breathing. He denies any alleviating factors. It does not radiate. Pt states he took 30 mg Oxycodone a few hours ago with no relief. Pt states h/o similar CP, but not as long lasting. He notes he was not evaluated for this pain because it resolved on its own. Pt reports this pain does not feel like prior acute chest syndrome. No h/o MI, PE/DVT. He is followed for sickle cell by Dr. Ronne BinningMckenzie. He states with prior sickle cell flare-ups, he normally has priapism. He states his HGB is normally around 9. He denies SOB, nausea, vomiting, diaphoresis, fever, chills, cough, dizziness. He denies any other pains.   ROS: See HPI Constitutional: no fever, diaphoresis, chills   Eyes: no drainage  ENT: no runny nose   Cardiovascular:  Positive for chest pain  Resp: no SOB, cough GI: no vomiting, nausea GU: no dysuria Integumentary: no rash  Allergy: no hives  Musculoskeletal: no leg swelling  Neurological: no slurred speech, dizziness ROS otherwise negative  PAST MEDICAL HISTORY/PAST SURGICAL HISTORY:  Past Medical History  Diagnosis Date  . Sickle cell disease (HCC)   . Acute chest syndrome(517.3)     ? in past    MEDICATIONS:  Prior to Admission medications   Medication Sig  Start Date End Date Taking? Authorizing Provider  cholecalciferol (VITAMIN D) 1000 UNITS tablet Take 1,000 Units by mouth daily.   Yes Historical Provider, MD  Multiple Vitamin (MULTIVITAMIN WITH MINERALS) TABS tablet Take 1 tablet by mouth daily.   Yes Historical Provider, MD  oxyCODONE (ROXICODONE) 15 MG immediate release tablet Take 15 mg by mouth 4 (four) times daily as needed for pain.  06/05/14  Yes Historical Provider, MD    ALLERGIES:  Allergies  Allergen Reactions  . Morphine And Related Other (See Comments)    "debilitates me"    SOCIAL HISTORY:  Social History  Substance Use Topics  . Smoking status: Former Smoker    Types: Cigarettes    Quit date: 10/02/2008  . Smokeless tobacco: Never Used  . Alcohol Use: Yes     Comment: socially but none recently    FAMILY HISTORY: History reviewed. No pertinent family history.  EXAM: BP 157/76 mmHg  Pulse 86  Temp(Src) 98.5 F (36.9 C) (Oral)  Resp 20  SpO2 92% CONSTITUTIONAL: Alert and oriented and responds appropriately to questions. Well-appearing; well-nourished HEAD: Normocephalic EYES: Conjunctivae clear, PERRL ENT: normal nose; no rhinorrhea; moist mucous membranes; pharynx without lesions noted NECK: Supple, no meningismus, no LAD  CARD: RRR; S1 and S2 appreciated; no murmurs, no clicks, no rubs, no gallops CHEST: non-tender to palpation without crepitus, ecchymosis, deformity, rash or lesions.  RESP: Normal chest excursion without splinting or tachypnea; breath sounds clear and equal bilaterally; no wheezes, no  rhonchi, no rales, no hypoxia or respiratory distress, speaking full sentences ABD/GI: Normal bowel sounds; non-distended; soft, non-tender, no rebound, no guarding, no peritoneal signs BACK:  The back appears normal and is non-tender to palpation, there is no CVA tenderness EXT: Normal ROM in all joints; non-tender to palpation; no edema; normal capillary refill; no cyanosis, no calf tenderness or swelling     SKIN: Normal color for age and race; warm NEURO: Moves all extremities equally, sensation to light touch intact diffusely, cranial nerves II through XII intact PSYCH: The patient's mood and manner are appropriate. Grooming and personal hygiene are appropriate.  MEDICAL DECISION MAKING: Patient here with left-sided chest pain that is not reproducible with palpation. Has a history of sickle cell. No fever or cough. Hemodynamically stable. EKG shows no ischemic change. Troponin negative. Chest x-ray clear. Other labs pending. We'll add on d-dimer as well given he is at risk for pulmonary embolus. We'll give IV fluids, Toradol and Dilaudid.   ED PROGRESS: 1:00 AM  Patient has a leukocytosis of 18,000 with left shift. AST and total bilirubin are elevated suggestive of sickle cell crisis. Troponin negative. D-dimer is elevated. Will obtain CT of patient's chest to rule out pulmonary embolus. Pain is controlled currently.    CT scan shows no acute abnormality. No pulmonary embolus, infiltrate, edema, pneumothorax. He reports his pain is well-controlled. Discussed with patient that his pain could be from a sickle cell crisis versus chest wall pain. He states he feels he is ready to go home and can control his pain at home. I feel this is a reasonable plan. He is very well-appearing, afebrile and nontoxic. Hemodynamically stable. Discussed return precautions. He verbalizes understanding and is comfortable with this plan.   EKG Interpretation  Date/Time:  Saturday January 06 2015 22:23:34 EST Ventricular Rate:  80 PR Interval:  175 QRS Duration: 96 QT Interval:  376 QTC Calculation: 434 R Axis:   12 Text Interpretation:  Sinus rhythm Atrial premature complexes RSR' in V1 or V2, probably normal variant Borderline T wave abnormalities since last tracing no significant change Confirmed by Effie Shy  MD, ELLIOTT [phone]) on 01/06/2015 10:55:08 PM        I personally performed the services described in this  documentation, which was scribed in my presence. The recorded information has been reviewed and is accurate.   Layla Maw Reginia Battie, DO 01/07/15 [phone]

## 2015-01-06 NOTE — ED Notes (Signed)
Pt c/o left side chest pain radiating to his flank, onset this a.m, denies shOB, palpitation, nausea or vomiting.

## 2015-01-06 NOTE — ED Notes (Signed)
Patient transported to X-ray 

## 2015-01-07 ENCOUNTER — Emergency Department (HOSPITAL_COMMUNITY): Payer: Medicaid Other

## 2015-01-07 LAB — I-STAT TROPONIN, ED: TROPONIN I, POC: 0 ng/mL (ref 0.00–0.08)

## 2015-01-07 LAB — RETICULOCYTES
RBC.: 4.01 MIL/uL — ABNORMAL LOW (ref 4.22–5.81)
Retic Count, Absolute: 505.3 10*3/uL — ABNORMAL HIGH (ref 19.0–186.0)
Retic Ct Pct: 12.6 % — ABNORMAL HIGH (ref 0.4–3.1)

## 2015-01-07 LAB — D-DIMER, QUANTITATIVE (NOT AT ARMC): D DIMER QUANT: 3.1 ug{FEU}/mL — AB (ref 0.00–0.50)

## 2015-01-07 MED ORDER — SODIUM CHLORIDE 0.9 % IV BOLUS (SEPSIS)
1000.0000 mL | Freq: Once | INTRAVENOUS | Status: AC
  Administered 2015-01-07: 1000 mL via INTRAVENOUS

## 2015-01-07 MED ORDER — IOHEXOL 350 MG/ML SOLN
100.0000 mL | Freq: Once | INTRAVENOUS | Status: AC | PRN
  Administered 2015-01-07: 100 mL via INTRAVENOUS

## 2015-01-07 NOTE — ED Notes (Signed)
Patient transported to CT 

## 2015-01-07 NOTE — ED Notes (Signed)
Writer observed pt recording registration on his phone, writer advised pt that you cannot record hospital staff.  Pt was adamant that he was not recording hospital staff, writer observed video on pt's phone.  Writer again advised pt to not record and that Clinical research associatewriter will notify [employer] and security.  Security at bedside and security stated that pt was using a live stream app on his phone.  Security advised pt that live streaming is also not allowed.

## 2015-01-07 NOTE — Discharge Instructions (Signed)
Nonspecific Chest Pain  °Chest pain can be caused by many different conditions. There is always a chance that your pain could be related to something serious, such as a heart attack or a blood clot in your lungs. Chest pain can also be caused by conditions that are not life-threatening. If you have chest pain, it is very important to follow up with your health care provider. °CAUSES  °Chest pain can be caused by: °· Heartburn. °· Pneumonia or bronchitis. °· Anxiety or stress. °· Inflammation around your heart (pericarditis) or lung (pleuritis or pleurisy). °· A blood clot in your lung. °· A collapsed lung (pneumothorax). It can develop suddenly on its own (spontaneous pneumothorax) or from trauma to the chest. °· Shingles infection (varicella-zoster virus). °· Heart attack. °· Damage to the bones, muscles, and cartilage that make up your chest wall. This can include: °¨ Bruised bones due to injury. °¨ Strained muscles or cartilage due to frequent or repeated coughing or overwork. °¨ Fracture to one or more ribs. °¨ Sore cartilage due to inflammation (costochondritis). °RISK FACTORS  °Risk factors for chest pain may include: °· Activities that increase your risk for trauma or injury to your chest. °· Respiratory infections or conditions that cause frequent coughing. °· Medical conditions or overeating that can cause heartburn. °· Heart disease or family history of heart disease. °· Conditions or health behaviors that increase your risk of developing a blood clot. °· Having had chicken pox (varicella zoster). °SIGNS AND SYMPTOMS °Chest pain can feel like: °· Burning or tingling on the surface of your chest or deep in your chest. °· Crushing, pressure, aching, or squeezing pain. °· Dull or sharp pain that is worse when you move, cough, or take a deep breath. °· Pain that is also felt in your back, neck, shoulder, or arm, or pain that spreads to any of these areas. °Your chest pain may come and go, or it may stay  constant. °DIAGNOSIS °Lab tests or other studies may be needed to find the cause of your pain. Your health care provider may have you take a test called an ambulatory ECG (electrocardiogram). An ECG records your heartbeat patterns at the time the test is performed. You may also have other tests, such as: °· Transthoracic echocardiogram (TTE). During echocardiography, sound waves are used to create a picture of all of the heart structures and to look at how blood flows through your heart. °· Transesophageal echocardiogram (TEE). This is a more advanced imaging test that obtains images from inside your body. It allows your health care provider to see your heart in finer detail. °· Cardiac monitoring. This allows your health care provider to monitor your heart rate and rhythm in real time. °· Holter monitor. This is a portable device that records your heartbeat and can help to diagnose abnormal heartbeats. It allows your health care provider to track your heart activity for several days, if needed. °· Stress tests. These can be done through exercise or by taking medicine that makes your heart beat more quickly. °· Blood tests. °· Imaging tests. °TREATMENT  °Your treatment depends on what is causing your chest pain. Treatment may include: °· Medicines. These may include: °¨ Acid blockers for heartburn. °¨ Anti-inflammatory medicine. °¨ Pain medicine for inflammatory conditions. °¨ Antibiotic medicine, if an infection is present. °¨ Medicines to dissolve blood clots. °¨ Medicines to treat coronary artery disease. °· Supportive care for conditions that do not require medicines. This may include: °¨ Resting. °¨ Applying heat   or cold packs to injured areas. °¨ Limiting activities until pain decreases. °HOME CARE INSTRUCTIONS °· If you were prescribed an antibiotic medicine, finish it all even if you start to feel better. °· Avoid any activities that bring on chest pain. °· Do not use any tobacco products, including  cigarettes, chewing tobacco, or electronic cigarettes. If you need help quitting, ask your health care provider. °· Do not drink alcohol. °· Take medicines only as directed by your health care provider. °· Keep all follow-up visits as directed by your health care provider. This is important. This includes any further testing if your chest pain does not go away. °· If heartburn is the cause for your chest pain, you may be told to keep your head raised (elevated) while sleeping. This reduces the chance that acid will go from your stomach into your esophagus. °· Make lifestyle changes as directed by your health care provider. These may include: °¨ Getting regular exercise. Ask your health care provider to suggest some activities that are safe for you. °¨ Eating a heart-healthy diet. A registered dietitian can help you to learn healthy eating options. °¨ Maintaining a healthy weight. °¨ Managing diabetes, if necessary. °¨ Reducing stress. °SEEK MEDICAL CARE IF: °· Your chest pain does not go away after treatment. °· You have a rash with blisters on your chest. °· You have a fever. °SEEK IMMEDIATE MEDICAL CARE IF:  °· Your chest pain is worse. °· You have an increasing cough, or you cough up blood. °· You have severe abdominal pain. °· You have severe weakness. °· You faint. °· You have chills. °· You have sudden, unexplained chest discomfort. °· You have sudden, unexplained discomfort in your arms, back, neck, or jaw. °· You have shortness of breath at any time. °· You suddenly start to sweat, or your skin gets clammy. °· You feel nauseous or you vomit. °· You suddenly feel light-headed or dizzy. °· Your heart begins to beat quickly, or it feels like it is skipping beats. °These symptoms may represent a serious problem that is an emergency. Do not wait to see if the symptoms will go away. Get medical help right away. Call your local emergency services (911 in the [place] Do not drive yourself to the hospital. °  °This  information is not intended to replace advice given to you by your health care provider. Make sure you discuss any questions you have with your health care provider. °  °Document Released: 10/16/2004 Document Revised: 01/27/2014 Document Reviewed: 08/12/2013 °Elsevier Interactive Patient Education ©2016 [employer] ° °

## 2015-01-07 NOTE — ED Notes (Signed)
MD at bedside. 

## 2015-01-07 NOTE — ED Notes (Signed)
PT. UNCOMPLIANT,UNCOOPERATIVE. PT. WAS REQUESTED TO KEEP RIGHT ARM STRAIGHT SO IV FLUID WILL BE INFUSING WELL WITHOUT DELAY. PT. TOLD THIS NURSE ,' ITS YOUR FAULT WHY YOU PUT THAT IV IN THAT SITE.' AND NOW YOU'RE TELLING ME WHAT TO DO!'

## 2015-12-28 IMAGING — CT CT ANGIO CHEST
2 of 6 series · 18 of 46 positions shown · IV contrast (OMNIPAQUE 300)
Comparison: 03/29/2012

CLINICAL DATA: Left-sided chest pain radiating to the flank since
yesterday. History of sickle cell and acute chest syndrome. Elevated
D-dimer.

EXAM:
CT ANGIOGRAPHY CHEST WITH CONTRAST
TECHNIQUE: Multidetector CT imaging of the chest was performed using the
standard protocol during bolus administration of intravenous
contrast. Multiplanar CT image reconstructions and MIPs were
obtained to evaluate the vascular anatomy.
CONTRAST:  100mL OMNIPAQUE IOHEXOL 350 MG/ML SOLN

[Series 6: thins for pacs · axial · 0.74mm/px · z∈[-366,-81]mm · 15 of 313 slices shown]
[im 14/313  lung]
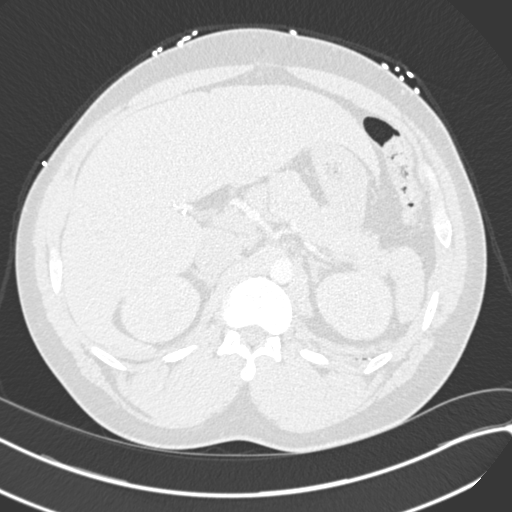
[im 41/313  soft-tissue]
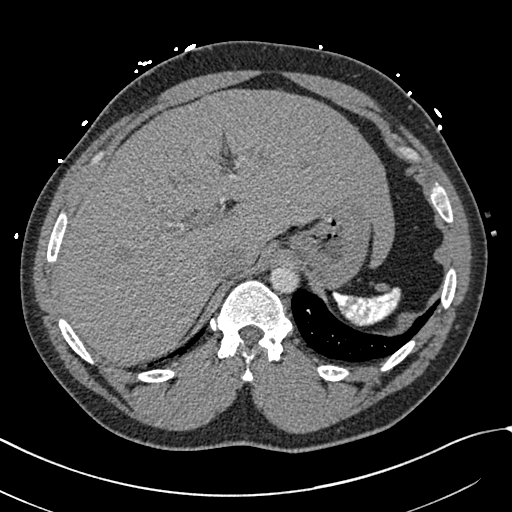
[im 55/313  lung]
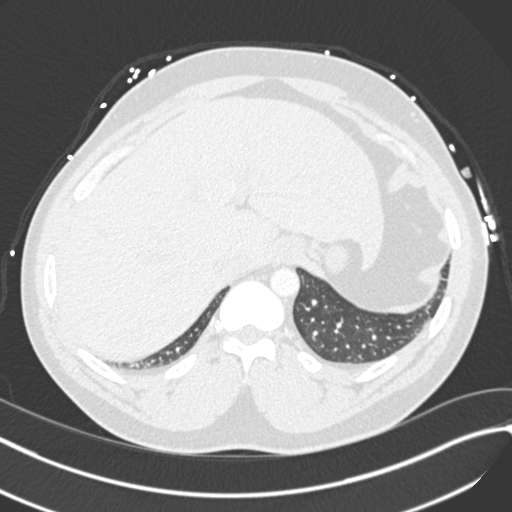
[im 82/313  soft-tissue]
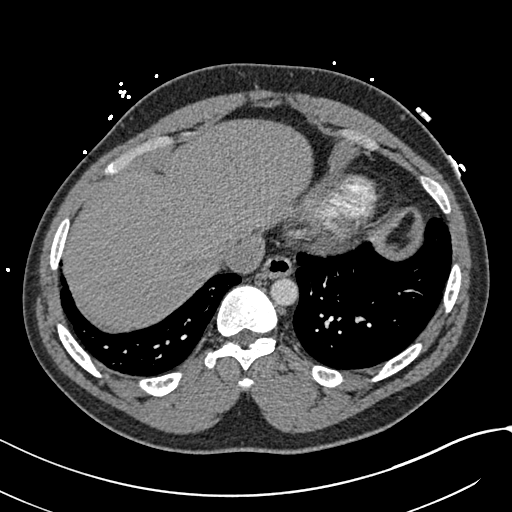
[im 95/313  lung]
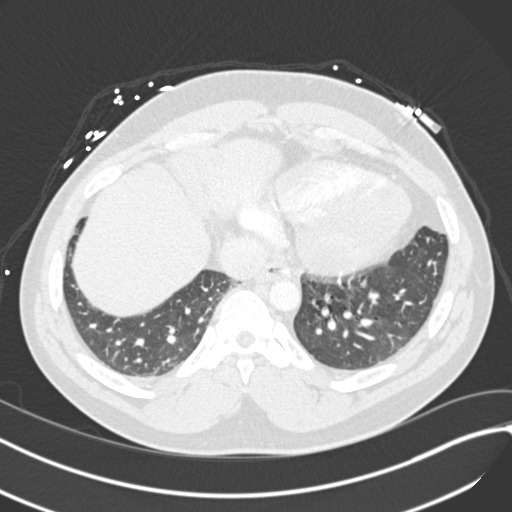
[im 123/313  soft-tissue]
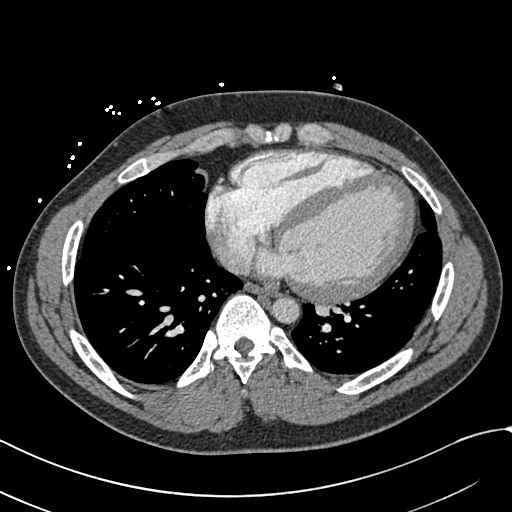
[im 136/313  lung]
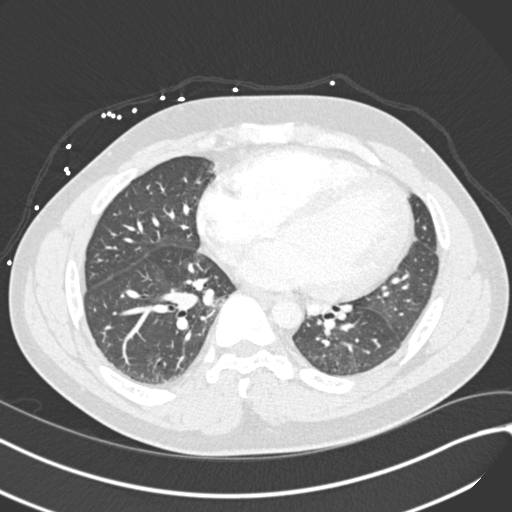
[im 163/313  soft-tissue]
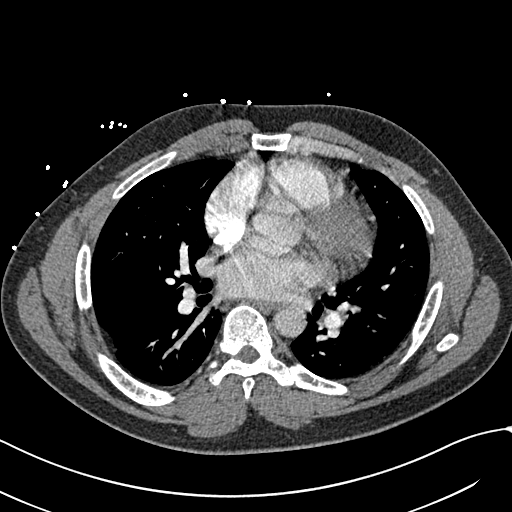
[im 177/313  lung]
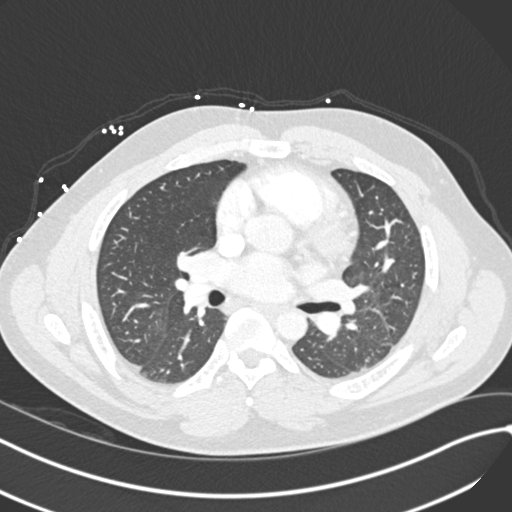
[im 190/313  soft-tissue]
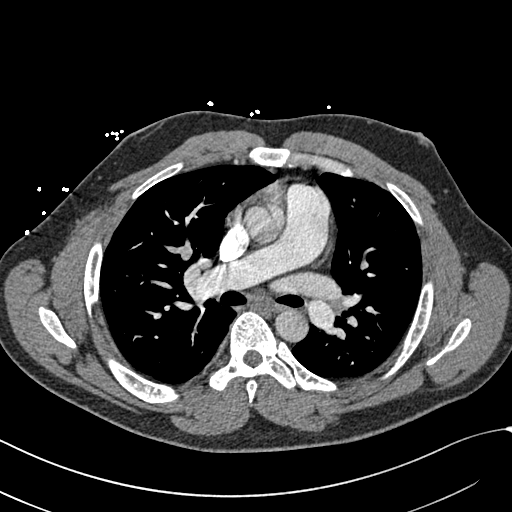
[im 218/313  lung]
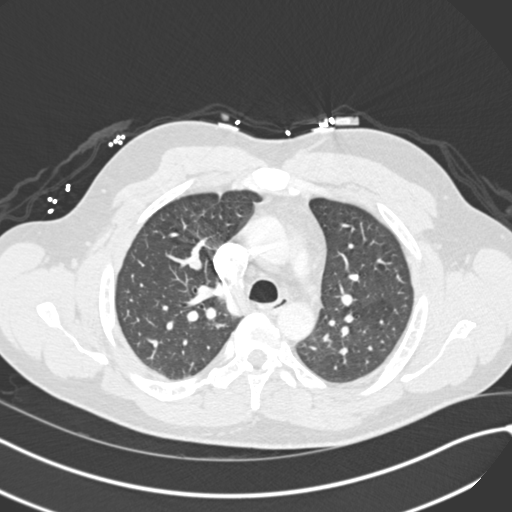
[im 231/313  soft-tissue]
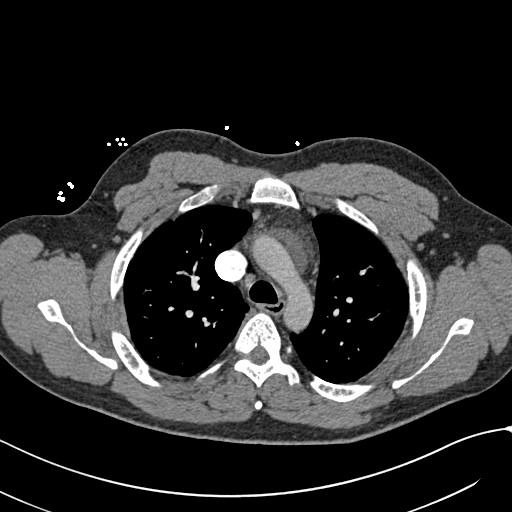
[im 258/313  lung]
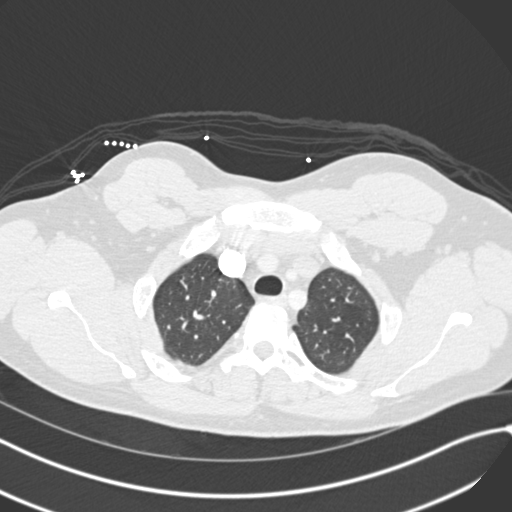
[im 272/313  soft-tissue]
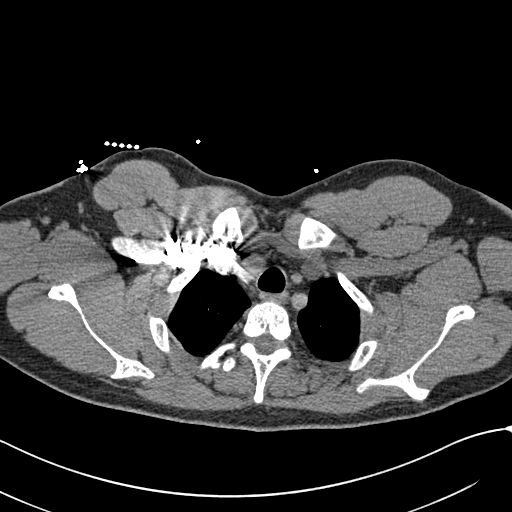
[im 299/313  lung]
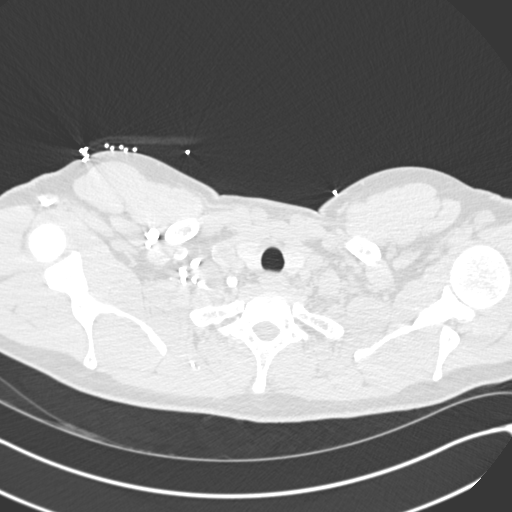

[Series 8: coronal mpr · coronal · 0.62mm/px · 3 of 133 slices shown]
[im 34/133  soft-tissue]
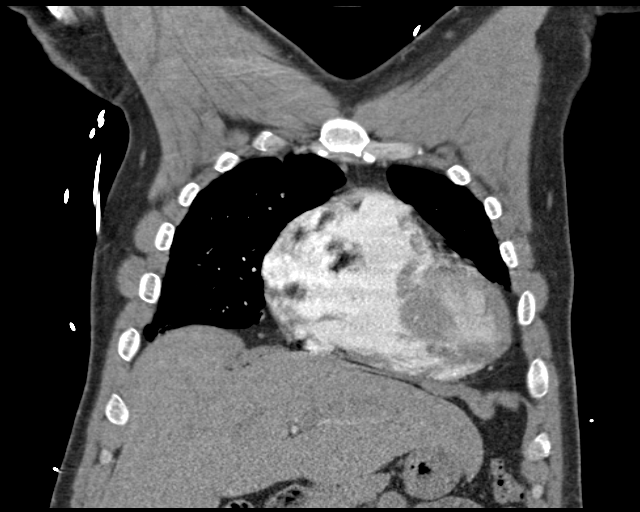
[im 67/133  soft-tissue]
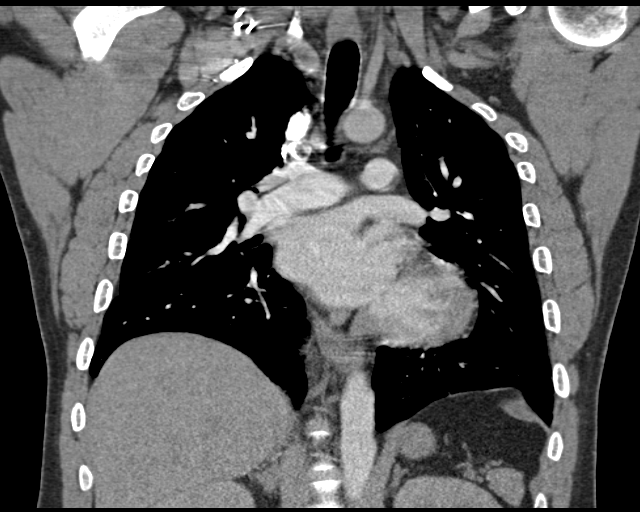
[im 100/133  soft-tissue]
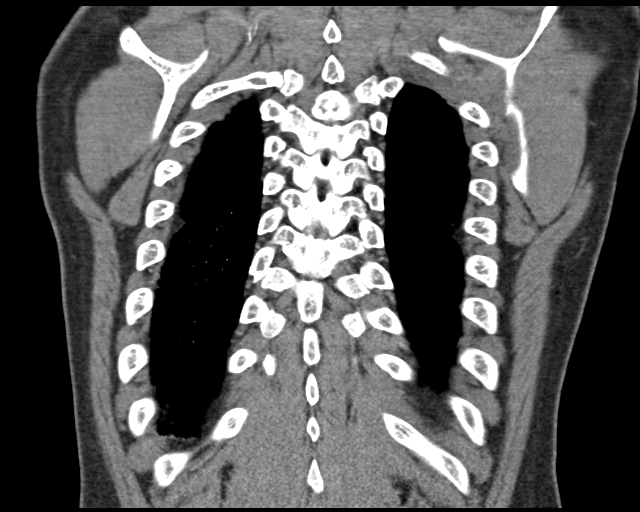

[18 of 46 positions shown; findings below may reference images not displayed]

FINDINGS: Technically adequate study with moderately good opacification of the
central and segmental pulmonary arteries. No focal filling defects
demonstrated. No evidence of significant pulmonary embolus.

Mild cardiac enlargement. Normal caliber thoracic aorta. No
significant lymphadenopathy in the chest. Esophagus is decompressed.

Mild interstitial changes in the lung bases possibly indicating
fibrosis. No focal airspace disease or consolidation. Airways appear
patent. No pleural effusions. No pneumothorax.

Included portions of the upper abdominal organs demonstrate surgical
absence of the gallbladder. The spleen is contracted and calcified
consistent with sickle cell changes. Suggestion of increased density
in the liver and kidneys possibly indicating iron overload related
to sickle cell disease. Vertebral bone changes consistent with
sickle cell changes.

Review of the MIP images confirms the above findings.
IMPRESSION: No evidence of significant pulmonary embolus. No evidence of active
pulmonary disease. Sickle cell changes demonstrated in the bones and
abdominal organs.
